# Patient Record
Sex: Male | Born: 1970 | Hispanic: Yes | Marital: Married | Smoking: Former smoker
Health system: Southern US, Community
[De-identification: ages and names within clinical notes are randomized; demographics above are authoritative.]

---

## 2016-08-03 ENCOUNTER — Emergency Department
Admission: EM | Admit: 2016-08-03 | Discharge: 2016-08-03 | Disposition: A | Discharge status: Home / Self Care | Payer: Self-pay | Source: Home / Self Care | Attending: Emergency Medicine | Admitting: Emergency Medicine

## 2016-08-03 ENCOUNTER — Telehealth: Payer: Self-pay | Admitting: *Deleted

## 2016-08-03 ENCOUNTER — Encounter: Payer: Self-pay | Admitting: *Deleted

## 2016-08-03 DIAGNOSIS — E049 Nontoxic goiter, unspecified: Secondary | ICD-10-CM

## 2016-08-03 MED ORDER — AZITHROMYCIN 250 MG PO TABS: ORAL_TABLET | ORAL | 0 refills | Status: DC

## 2016-08-03 MED ORDER — PREDNISONE 50 MG PO TABS: 50.0000 mg | ORAL_TABLET | Freq: Every day | ORAL | 0 refills | Status: DC

## 2016-08-03 MED ORDER — PROMETHAZINE-CODEINE 6.25-10 MG/5ML PO SYRP: ORAL_SOLUTION | ORAL | 0 refills | Status: DC

## 2016-08-03 MED ORDER — FLUTICASONE PROPIONATE 50 MCG/ACT NA SUSP: NASAL | 0 refills | Status: DC

## 2016-08-03 NOTE — Discharge Instructions (Signed)
You need to see a thyroid specialist for further evaluation and treatment. We will try to assist with referral to [facility] in [place] at your request.

## 2016-08-03 NOTE — ED Provider Notes (Signed)
[facility]    CSN: [id number] Arrival date & time: 08/03/16  0932     History   Chief Complaint Chief Complaint  Patient presents with  . Oral Swelling    HPI [facility] Samuel Cherry is a 46 y.o. male.   HPI  History reviewed. No pertinent past medical history.  There are no active problems to display for this patient.   History reviewed. No pertinent surgical history.   painless swelling anterior neck for 1 year. No fever or chills or nausea or vomiting or any definite dysphagia. He otherwise feels well.  He states his chief complaint is referral to an endocrinologist, he specifically requests referral to [name] in [facility] Medications     Family History History reviewed. No pertinent family history.  Social History Social History  Substance Use Topics  . Smoking status: Current Every Day Smoker  . Smokeless tobacco: Never Used     Comment: 1-2 cigs QD  . Alcohol use Yes     Comment: rarely     Allergies   Patient has no known allergies.   Review of Systems Review of Systems No fever  Physical Exam Triage Vital Signs ED Triage Vitals  Enc Vitals Group     BP 08/03/16 1007 138/86     Pulse Rate 08/03/16 1007 86     Resp 08/03/16 1007 16     Temp 08/03/16 1007 98.3 F (36.8 C)     Temp Source 08/03/16 1007 Oral     SpO2 08/03/16 1007 97 %     Weight 08/03/16 1008 292 lb (132.5 kg)     Height 08/03/16 1008  (1.803 m)     Head Circumference --      Peak Flow --      Pain Score 08/03/16 1008 0     Pain Loc --      Pain Edu? --      Excl. in GC? --    No data found.   Updated Vital Signs BP 138/86 (BP Location: Left Arm)   Pulse 86   Temp 98.3 F (36.8 C) (Oral)   Resp 16   Ht  (1.803 m)   Wt 292 lb (132.5 kg)   SpO2 97%   BMI 40.73 kg/m   Visual Acuity Right Eye Distance:   Left Eye Distance:   Bilateral Distance:    Right Eye Near:   Left Eye Near:    Bilateral Near:      Physical Exam Alert, no distress. ENT: Normal Neck: 5 x 5 cm firm slightly mobile anterior neck mass in the midline. No definite cervical or supraclavicular adenopathy   UC Treatments / Results  Labs (all labs ordered are listed, but only abnormal results are displayed) Labs Reviewed - No data to display  EKG  EKG Interpretation None       Radiology No results found.  Procedures Procedures (including critical care time)  Medications Ordered in UC Medications - No data to display   Initial Impression / Assessment and Plan / UC Course  I have reviewed the triage vital signs and the nursing notes.  Pertinent labs & imaging results that were available during my care of the patient were reviewed by me and considered in my medical decision making (see chart for details).    Final Clinical Impressions(s) / UC Diagnoses  Goiter/anterior neck mass for 1 year. He declined any blood testing today here in urgent care. I  agree with referral. Discussed option of ENT versus endocrinologist. Per his request,  referral to [facility] in [place] made for 08/09/2016.-Nurse gave him details of that referral appointment.    An After Visit Summary was printed and given to the patient.    Lajean Manes, MD 08/03/16 [phone]

## 2016-08-03 NOTE — ED Triage Notes (Signed)
Pt c/o swollen area in his neck x 1 year. He reports taking ABT's a year ago with some relief. Denies pain.

## 2016-08-03 NOTE — Telephone Encounter (Signed)
appt sch'ed with Dr Lucianne Muss for 08/09/16 @ 10:45am. Pt notified to bring $200.25 to his first appt since he is uninsured. Given info to call [facility] to apply for pt assistance.

## 2016-08-09 ENCOUNTER — Ambulatory Visit (INDEPENDENT_AMBULATORY_CARE_PROVIDER_SITE_OTHER): Payer: Self-pay | Admitting: Endocrinology

## 2016-08-09 ENCOUNTER — Encounter: Payer: Self-pay | Admitting: Endocrinology

## 2016-08-09 VITALS — BP 136/90 | HR 81 | Ht 71.0 in | Wt 290.0 lb

## 2016-08-09 DIAGNOSIS — R221 Localized swelling, mass and lump, neck: Secondary | ICD-10-CM

## 2016-08-09 NOTE — Progress Notes (Addendum)
Patient ID: Samuel Cherry, male   DOB: 01/15/1971, 46 y.o.   MRN: [id number]            Referring physician: Dr. Georgina Pillion   Reason for Appointment: Evaluation of swelling in neck    History of Present Illness:   The patient apparently noticed the swelling in his neck about a year ago which was not uncomfortable and not associated with any difficulty swallowing or change in voice More recently his wife noticed the swelling in the front of his neck and he was evaluated in the urgent care He was told that he had a thyroid nodule and is not referred for further evaluation He has not had any other studies done   No results found for: FREET4, TSH  Allergies as of 08/09/2016   No Known Allergies     Medication List    as of 08/09/2016 12:55 PM   You have not been prescribed any medications.     Allergies: No Known Allergies  History reviewed. No pertinent past medical history.   History reviewed. No pertinent surgical history.  Family History  Problem Relation Age of Onset  . Thyroid disease Neg Hx     Social History:  reports that he has been smoking.  He has never used smokeless tobacco. He reports that he drinks alcohol. He reports that he does not use drugs.   Review of Systems:  No history of recent weight change  He has some tiredness but he thinks this is from his going to bed late at night  No recent respiratory infections         Examination:   BP 136/90   Pulse 81   Ht  (1.803 m)   Wt 290 lb (131.5 kg)   BMI 40.45 kg/m    General Appearance:  well-looking        Eyes: No  prominence or swelling of the eyes         THYROID: This is not palpable He has a globular midline smooth slightly firm swelling in his anterior neck overlying the larynx and slightly inferior to the larynx; upper trachea is palpable below the swelling No swelling of the thyroid isthmus is palpable There is no lymphadenopathy in the  neck  Assessment/Plan:  Cystic-appearing swelling in the upper middle of the neck This may be a thyroglossal cyst or other cystic lesion Does not appear to have thyroid enlargement  Will have him get an ultrasound done and further management will depend on the results  [facility] 08/09/2016  Addendum: Ultrasound confirms complex cystic lesions of the neck and may well be congenital Since this is not acute and long-standing does not need intervention, given patient the option of going to an ENT surgeon No follow-up necessary here

## 2016-08-11 ENCOUNTER — Telehealth: Payer: Self-pay | Admitting: Endocrinology

## 2016-08-11 ENCOUNTER — Other Ambulatory Visit: Payer: Self-pay | Admitting: Endocrinology

## 2016-08-11 DIAGNOSIS — R221 Localized swelling, mass and lump, neck: Secondary | ICD-10-CM

## 2016-08-11 NOTE — Telephone Encounter (Signed)
Patient has been notified

## 2016-08-11 NOTE — Telephone Encounter (Signed)
New order has been done for [facility] imaging

## 2016-08-11 NOTE — Telephone Encounter (Addendum)
Patient wife stated she got a call from [facility] to schedule and appointment, he stated kumar did not say anything about it, but told him to go [place] imaging. Please advise  Evette [phone]

## 2016-08-11 NOTE — Telephone Encounter (Signed)
Pt called back in and request that a new order be sent to [facility], they said it is much more affordable there.

## 2016-08-11 NOTE — Telephone Encounter (Signed)
He wanted to get his neck ultrasound from a [facility] facility because of lack of insurance coverage and [facility] imaging is not part of this.  Please find out what needs to be done from our referral person

## 2016-08-12 ENCOUNTER — Other Ambulatory Visit: Payer: Self-pay | Admitting: Internal Medicine

## 2016-08-12 ENCOUNTER — Telehealth: Payer: Self-pay

## 2016-08-12 NOTE — Telephone Encounter (Signed)
This was done yesterday, please check the chart for confirmation when you get a question

## 2016-08-12 NOTE — Telephone Encounter (Signed)
[facility] called stating the orders for this patient are still not in please advise

## 2016-08-12 NOTE — Telephone Encounter (Signed)
Pt called back and said that [facility] has not gotten the referral and they want to know what is going on.

## 2016-08-13 ENCOUNTER — Other Ambulatory Visit: Payer: Self-pay | Admitting: Endocrinology

## 2016-08-13 DIAGNOSIS — R221 Localized swelling, mass and lump, neck: Secondary | ICD-10-CM

## 2016-08-13 NOTE — Telephone Encounter (Signed)
Called [facility] to inform that the order was placed and to give me a call back if they still can not see the order

## 2016-08-13 NOTE — Telephone Encounter (Signed)
[facility] called back and stated that the order is not viewable by them. The [place] order needs to be placed under the imaging tab so that [facility] can call the patient to schedule the appointment

## 2016-08-13 NOTE — Telephone Encounter (Signed)
I have ordered the ultrasound again as usual, I do not know of any other way to order

## 2016-08-16 ENCOUNTER — Ambulatory Visit (HOSPITAL_COMMUNITY): Payer: Self-pay

## 2016-08-19 ENCOUNTER — Ambulatory Visit
Admission: RE | Admit: 2016-08-19 | Discharge: 2016-08-19 | Disposition: A | Discharge status: Home / Self Care | Payer: No Typology Code available for payment source | Source: Ambulatory Visit | Attending: Endocrinology | Admitting: Endocrinology

## 2016-08-19 DIAGNOSIS — R221 Localized swelling, mass and lump, neck: Secondary | ICD-10-CM

## 2016-08-19 NOTE — Progress Notes (Signed)
Please call to let patient know that the ultrasound shows fluid collections and no thyroid problem Since these have been present for a long time does not necessarily need treatment unless having discomfort.  If he wants he can be referred to a ENT physician

## 2016-08-21 ENCOUNTER — Other Ambulatory Visit: Payer: Self-pay | Admitting: Endocrinology

## 2016-08-21 DIAGNOSIS — Q892 Congenital malformations of other endocrine glands: Secondary | ICD-10-CM

## 2018-06-02 ENCOUNTER — Encounter: Payer: Self-pay | Admitting: Family Medicine

## 2018-06-02 ENCOUNTER — Ambulatory Visit: Payer: No Typology Code available for payment source | Admitting: Family Medicine

## 2018-06-02 VITALS — BP 122/68 | HR 89 | Ht 71.0 in | Wt 269.0 lb

## 2018-06-02 DIAGNOSIS — R7309 Other abnormal glucose: Secondary | ICD-10-CM

## 2018-06-02 DIAGNOSIS — E119 Type 2 diabetes mellitus without complications: Secondary | ICD-10-CM | POA: Insufficient documentation

## 2018-06-02 DIAGNOSIS — E1165 Type 2 diabetes mellitus with hyperglycemia: Secondary | ICD-10-CM | POA: Diagnosis not present

## 2018-06-02 DIAGNOSIS — R531 Weakness: Secondary | ICD-10-CM | POA: Diagnosis not present

## 2018-06-02 DIAGNOSIS — R5383 Other fatigue: Secondary | ICD-10-CM

## 2018-06-02 DIAGNOSIS — Z23 Encounter for immunization: Secondary | ICD-10-CM

## 2018-06-02 DIAGNOSIS — R748 Abnormal levels of other serum enzymes: Secondary | ICD-10-CM

## 2018-06-02 DIAGNOSIS — Z6837 Body mass index (BMI) 37.0-37.9, adult: Secondary | ICD-10-CM

## 2018-06-02 LAB — POCT UA - MICROALBUMIN
Albumin/Creatinine Ratio, Urine, POC: <30
Creatinine, POC: 300 mg/dL
Microalbumin Ur, POC: 80 mg/L

## 2018-06-02 LAB — POCT GLYCOSYLATED HEMOGLOBIN (HGB A1C): Hemoglobin A1C: 10.3 % — AB (ref 4.0–5.6)

## 2018-06-02 MED ORDER — GLIPIZIDE 5 MG PO TABS: 5.0000 mg | ORAL_TABLET | Freq: Two times a day (BID) | ORAL | 3 refills | Status: DC

## 2018-06-02 MED ORDER — METFORMIN HCL 1000 MG PO TABS: 1000.0000 mg | ORAL_TABLET | Freq: Two times a day (BID) | ORAL | 3 refills | Status: DC

## 2018-06-02 NOTE — Patient Instructions (Signed)
Diabetes mellitus y nutricin, en adultos  Diabetes Mellitus and Nutrition, Adult  Si sufre de diabetes (diabetes mellitus), es muy importante tener hbitos alimenticios saludables debido a que sus niveles de azcar en la sangre (glucosa) se ven afectados en gran medida por lo que come y bebe. Comer alimentos saludables en las cantidades adecuadas, aproximadamente a la misma hora todos los das, lo ayudar a:   Controlar la glucemia.   Disminuir el riesgo de sufrir una enfermedad cardaca.   Mejorar la presin arterial.   Alcanzar o mantener un peso saludable.  Todas las personas que sufren de diabetes son diferentes y cada una tiene necesidades diferentes en cuanto a un plan de alimentacin. El mdico puede recomendarle que trabaje con un especialista en dietas y nutricin (nutricionista) para elaborar el mejor plan para usted. Su plan de alimentacin puede variar segn factores como:   Las caloras que necesita.   Los medicamentos que toma.   Su peso.   Sus niveles de glucemia, presin arterial y colesterol.   Su nivel de actividad.   Otras afecciones que tenga, como enfermedades cardacas o renales.  Cmo me afectan los carbohidratos?  Los carbohidratos, o hidratos de carbono, afectan su nivel de glucemia ms que cualquier otro tipo de alimento. La ingesta de carbohidratos naturalmente aumenta la cantidad de glucosa en la sangre. El recuento de carbohidratos es un mtodo destinado a llevar un registro de la cantidad de carbohidratos que se consumen. El recuento de carbohidratos es importante para mantener la glucemia a un nivel saludable, especialmente si utiliza insulina o toma determinados medicamentos por va oral para la diabetes.  Es importante conocer la cantidad de carbohidratos que se pueden ingerir en cada comida sin correr ningn riesgo. Esto es diferente en cada persona. Su nutricionista puede ayudarlo a calcular la cantidad de carbohidratos que debe ingerir en cada comida y en cada  refrigerio.  Entre los alimentos que contienen carbohidratos, se incluyen:   Pan, cereal, arroz, pastas y galletas.   Papas y maz.   Guisantes, frijoles y lentejas.   Leche y yogur.   Frutas y jugo.   Postres, como pasteles, galletas, helado y caramelos.  Cmo me afecta el alcohol?  El alcohol puede provocar disminuciones sbitas de la glucemia (hipoglucemia), especialmente si utiliza insulina o toma determinados medicamentos por va oral para la diabetes. La hipoglucemia es una afeccin potencialmente mortal. Los sntomas de la hipoglucemia (somnolencia, mareos y confusin) son similares a los sntomas de haber consumido demasiado alcohol.  Si el mdico afirma que el alcohol es seguro para usted, siga estas pautas:   Limite el consumo de alcohol a no ms de 1medida por da si es mujer y no est embarazada, y a 2medidas si es hombre. Una medida equivale a 12oz (355ml) de cerveza, 5oz (148ml) de vino o 1oz (44ml) de bebidas alcohlicas de alta graduacin.   No beba con el estmago vaco.   Mantngase hidratado bebiendo agua, refrescos dietticos o t helado sin azcar.   Tenga en cuenta que los refrescos comunes, los jugos y otras bebida para mezclar pueden contener mucha azcar y se deben contar como carbohidratos.  Cules son algunos consejos para seguir este plan?    Leer las etiquetas de los alimentos   Comience por leer el tamao de la porcin en la "Informacin nutricional" en las etiquetas de los alimentos envasados y las bebidas. La cantidad de caloras, carbohidratos, grasas y otros nutrientes mencionados en la etiqueta se basan en una porcin del alimento.   Muchos alimentos contienen ms de una porcin por envase.   Verifique la cantidad total de gramos (g) de carbohidratos totales en una porcin. Puede calcular la cantidad de porciones de carbohidratos al dividir el total de carbohidratos por 15. Por ejemplo, si un alimento tiene un total de 30g de carbohidratos, equivale a 2  porciones de carbohidratos.   Verifique la cantidad de gramos (g) de grasas saturadas y grasas trans en una porcin. Escoja alimentos que no contengan grasa o que tengan un bajo contenido.   Verifique la cantidad de miligramos (mg) de sal (sodio) en una porcin. La mayora de las personas deben limitar la ingesta de sodio total a menos de 2300mg por da.   Siempre consulte la informacin nutricional de los alimentos etiquetados como "con bajo contenido de grasa" o "sin grasa". Estos alimentos pueden tener un mayor contenido de azcar agregada o carbohidratos refinados, y deben evitarse.   Hable con su nutricionista para identificar sus objetivos diarios en cuanto a los nutrientes mencionados en la etiqueta.  Al ir de compras   Evite comprar alimentos procesados, enlatados o precocinados. Estos alimentos tienden a tener una mayor cantidad de grasa, sodio y azcar agregada.   Compre en la zona exterior de la tienda de comestibles. Esta zona incluye frutas y verduras frescas, granos a granel, carnes frescas y productos lcteos frescos.  Al cocinar   Utilice mtodos de coccin a baja temperatura, como hornear, en lugar de mtodos de coccin a alta temperatura, como frer en abundante aceite.   Cocine con aceites saludables, como el aceite de oliva, canola o girasol.   Evite cocinar con manteca, crema o carnes con alto contenido de grasa.  Planificacin de las comidas   Coma las comidas y los refrigerios regularmente, preferentemente a la misma hora todos los das. Evite pasar largos perodos de tiempo sin comer.   Consuma alimentos ricos en fibra, como frutas frescas, verduras, frijoles y cereales integrales. Consulte a su nutricionista sobre cuntas porciones de carbohidratos puede consumir en cada comida.   Consuma entre 4 y 6 onzas (oz) de protenas magras por da, como carnes magras, pollo, pescado, huevos o tofu. Una onza de protena magra equivale a:  ? 1 onza de carne, pollo o  pescado.  ? 1huevo.  ?  taza de tofu.   Coma algunos alimentos por da que contengan grasas saludables, como aguacates, frutos secos, semillas y pescado.  Estilo de vida   Controle su nivel de glucemia con regularidad.   Haga actividad fsica habitualmente como se lo haya indicado el mdico. Esto puede incluir lo siguiente:  ? 150minutos semanales de ejercicio de intensidad moderada o alta. Esto podra incluir caminatas dinmicas, ciclismo o gimnasia acutica.  ? Realizar ejercicios de elongacin y de fortalecimiento, como yoga o levantamiento de pesas, por lo menos 2veces por semana.   Tome los medicamentos como se lo haya indicado el mdico.   No consuma ningn producto que contenga nicotina o tabaco, como cigarrillos y cigarrillos electrnicos. Si necesita ayuda para dejar de fumar, consulte al mdico.   Trabaje con un asesor o instructor en diabetes para identificar estrategias para controlar el estrs y cualquier desafo emocional y social.  Preguntas para hacerle al mdico   Es necesario que consulte a un instructor en el cuidado de la diabetes?   Es necesario que me rena con un nutricionista?   A qu nmero puedo llamar si tengo preguntas?   Cules son los mejores momentos para controlar la glucemia?  Dnde   encontrar ms informacin:   Asociacin Estadounidense de la Diabetes (American Diabetes Association): diabetes.org   Academia de Nutricin y Diettica (Academy of Nutrition and Dietetics): www.eatright.org   Instituto Nacional de la Diabetes y las Enfermedades Digestivas y Renales (National Institute of Diabetes and Digestive and Kidney Diseases, NIH): www.niddk.nih.gov  Resumen   Un plan de alimentacin saludable lo ayudar a controlar la glucemia y mantener un estilo de vida saludable.   Trabajar con un especialista en dietas y nutricin (nutricionista) puede ayudarlo a elaborar el mejor plan de alimentacin para usted.   Tenga en cuenta que los carbohidratos (hidratos de  carbono) y el alcohol tienen efectos inmediatos en sus niveles de glucemia. Es importante contar los carbohidratos que ingiere y consumir alcohol con prudencia.  Esta informacin no tiene como fin reemplazar el consejo del mdico. Asegrese de hacerle al mdico cualquier pregunta que tenga.  Document Released: 07/13/2007 Document Revised: 12/14/2016 Document Reviewed: 07/26/2016  Elsevier Interactive Patient Education  2019 Elsevier Inc.

## 2018-06-02 NOTE — Progress Notes (Signed)
New Patient Office Visit  Subjective:  Patient ID: Samuel Cherry, male    DOB: [birth year]  Age: 48 y.o. MRN: [id number]  CC:  Chief Complaint  Patient presents with  . Establish Care    dx w/DM at the hospital. pt was here visiting from [place]    HPI Samuel Cherry presents for Diabetes.  + fam hx.  He was dx in December 2019 when seen in the ED for viral gastroenteritis.  He reports inc urination and vision changes. He is here today with his wife.    He was also noted ot have elevated liver enzymes. He is overweight.  Doesn't really drink much alcohol.   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 Moshe Cipro.  Reveiwed ED note from December.  -He had actually gone to the ED for an episode of nausea vomiting, diarrhea and fever.  Mostly had gone because he had vomited some bright red blood.  It actually recommended that he follow-up with GI but he has not done that yet.  His AST was elevated at 75, ALT elevated at 86 and the alkaline phosphatase was high at 161.  Renal function was normal.  Kos was also elevated over 200 at that time.   Hepatic Function Latest Ref Rng & Units 06/02/2018  Total Protein 6.1 - 8.1 g/dL 7.3  AST 10 - 40 U/L 25(W)  ALT 9 - 46 U/L 56(H)  Total Bilirubin 0.2 - 1.2 mg/dL 0.4     No past medical history on file.  No past surgical history on file.  Family History  Problem Relation Age of Onset  . Thyroid disease Neg Hx     Social History   Socioeconomic History  . Marital status: Married    Spouse name: Not on file  . Number of children: Not on file  . Years of education: Not on file  . Highest education level: Not on file  Occupational History  . Not on file  Social Needs  . Financial resource strain: Not on file  . Food insecurity:    Worry: Not on file    Inability: Not on file  . Transportation needs:    Medical: Not on file     Non-medical: Not on file  Tobacco Use  . Smoking status: Former Smoker    Last attempt to quit: 04/20/2016    Years since quitting: 2.1  . Smokeless tobacco: Never Used  . Tobacco comment: 1-2 cigs QD  Substance and Sexual Activity  . Alcohol use: Yes    Alcohol/week: 1.0 standard drinks    Types: 1 Cans of beer per week    Comment: rarely  . Drug use: No  . Sexual activity: Not on file  Lifestyle  . Physical activity:    Days per week: Not on file    Minutes per session: Not on file  . Stress: Not on file  Relationships  . Social connections:    Talks on phone: Not on file    Gets together: Not on file    Attends religious service: Not on file    Active member of club or organization: Not on file    Attends meetings of clubs or organizations: Not on file    Relationship status: Not on file  . Intimate partner violence:    Fear of current or ex partner: Not on file  Emotionally abused: Not on file    Physically abused: Not on file    Forced sexual activity: Not on file  Other Topics Concern  . Not on file  Social History Narrative  . Not on file    ROS Review of Systems  Objective:   Today's Vitals: BP 122/68   Pulse 89   Ht 5\' 11"  (1.803 m)   Wt 269 lb (122 kg)   SpO2 97%   BMI 37.52 kg/m   Physical Exam Constitutional:      Appearance: Normal appearance. He is well-developed. He is obese.  HENT:     Head: Normocephalic and atraumatic.     Nose: Nose normal.  Eyes:     Conjunctiva/sclera: Conjunctivae normal.     Pupils: Pupils are equal, round, and reactive to light.  Neck:     Musculoskeletal: Neck supple.     Vascular: No carotid bruit.  Cardiovascular:     Rate and Rhythm: Normal rate and regular rhythm.     Heart sounds: Normal heart sounds.  Pulmonary:     Effort: Pulmonary effort is normal.     Breath sounds: Normal breath sounds.  Abdominal:     General: Bowel sounds are normal.     Palpations: Abdomen is soft. There is no mass.   Skin:    General: Skin is warm and dry.  Neurological:     Mental Status: He is alert and oriented to person, place, and time.  Psychiatric:        Behavior: Behavior normal.     Assessment & Plan:   Problem List Items Addressed This Visit      Endocrine   Uncontrolled type 2 diabetes mellitus with hyperglycemia (HCC)    Discussed tx options. Will start with metformin and glipizide. Rx given for meter. Dicussed need for dietary changes. Will refer for nutrition therapy.        Relevant Medications   metFORMIN (GLUCOPHAGE) 1000 MG tablet   glipiZIDE (GLUCOTROL) 5 MG tablet   Other Relevant Orders   CBC (Completed)   COMPLETE METABOLIC PANEL WITH GFR (Completed)   TSH (Completed)   Hepatitis panel, acute (Completed)   Amb ref to Medical Nutrition Therapy-MNT     Other   Weakness   Relevant Orders   CBC (Completed)   COMPLETE METABOLIC PANEL WITH GFR (Completed)   TSH (Completed)   Hepatitis panel, acute (Completed)   Elevated liver enzymes    Will recheck enzymes. May be seconday to uncontrolled DM.  Also consider fatty liver so may need further w/u if enzymes are not improving.       Relevant Orders   CBC (Completed)   COMPLETE METABOLIC PANEL WITH GFR (Completed)   TSH (Completed)   Hepatitis panel, acute (Completed)   BMI 37.0-37.9, adult    Discussed weight loss.        Other Visit Diagnoses    Abnormal glucose    -  Primary   Relevant Orders   POCT glycosylated hemoglobin (Hb A1C) (Completed)   POCT UA - Microalbumin (Completed)   CBC (Completed)   COMPLETE METABOLIC PANEL WITH GFR (Completed)   TSH (Completed)   Hepatitis panel, acute (Completed)   Need for tetanus, diphtheria, and acellular pertussis (Tdap) vaccine in patient of adolescent age or older       Relevant Orders   Tdap vaccine greater than or equal to 7yo IM (Completed)   Fatigue, unspecified type       Relevant Orders  CBC (Completed)   COMPLETE METABOLIC PANEL WITH GFR (Completed)    TSH (Completed)   Hepatitis panel, acute (Completed)     Fatigue - will check for anemia and thyroid dz.     Tdap given.    Outpatient Encounter Medications as of 06/02/2018  Medication Sig  . [DISCONTINUED] dicyclomine (BENTYL) 20 MG tablet Take 1 tablet by mouth 3 (three) times daily as needed.  . [DISCONTINUED] omeprazole (PRILOSEC) 20 MG capsule Take 2 capsules by mouth daily.  Marland Kitchen. glipiZIDE (GLUCOTROL) 5 MG tablet Take 1 tablet (5 mg total) by mouth 2 (two) times daily before a meal.  . metFORMIN (GLUCOPHAGE) 1000 MG tablet Take 1 tablet (1,000 mg total) by mouth 2 (two) times daily with a meal.  . [DISCONTINUED] metFORMIN (GLUCOPHAGE) 500 MG tablet Take 1 tablet by mouth 2 (two) times daily with a meal.   No facility-administered encounter medications on file as of 06/02/2018.     Follow-up: Return in about 6 weeks (around 07/14/2018) for Diabetes .   Nani Gasseratherine Asher Babilonia, MD

## 2018-06-03 LAB — HEPATITIS PANEL, ACUTE
Hep A IgM: NONREACTIVE
Hep B C IgM: NONREACTIVE
Hepatitis B Surface Ag: NONREACTIVE
Hepatitis C Ab: NONREACTIVE
SIGNAL TO CUT-OFF: 0.02 (ref <1.00)

## 2018-06-03 LAB — CBC
HCT: 44 % (ref 38.5–50.0)
Hemoglobin: 14.8 g/dL (ref 13.2–17.1)
MCH: 30.6 pg (ref 27.0–33.0)
MCHC: 33.6 g/dL (ref 32.0–36.0)
MCV: 91.1 fL (ref 80.0–100.0)
MPV: 12.3 fL (ref 7.5–12.5)
Platelets: 307 10*3/uL (ref 140–400)
RBC: 4.83 10*6/uL (ref 4.20–5.80)
RDW: 11.8 % (ref 11.0–15.0)
WBC: 9.4 10*3/uL (ref 3.8–10.8)

## 2018-06-03 LAB — COMPLETE METABOLIC PANEL WITH GFR
AG Ratio: 1 (calc) (ref 1.0–2.5)
ALT: 56 U/L — ABNORMAL HIGH (ref 9–46)
AST: 41 U/L — ABNORMAL HIGH (ref 10–40)
Albumin: 3.7 g/dL (ref 3.6–5.1)
Alkaline phosphatase (APISO): 124 U/L (ref 36–130)
BUN: 12 mg/dL (ref 7–25)
CO2: 23 mmol/L (ref 20–32)
Calcium: 9.6 mg/dL (ref 8.6–10.3)
Chloride: 105 mmol/L (ref 98–110)
Creat: 0.75 mg/dL (ref 0.60–1.35)
GFR, Est African American: 127 mL/min/{1.73_m2} (ref >=60)
GFR, Est Non African American: 109 mL/min/{1.73_m2} (ref >=60)
Globulin: 3.6 g/dL (calc) (ref 1.9–3.7)
Glucose, Bld: 200 mg/dL — ABNORMAL HIGH (ref 65–99)
Potassium: 4.9 mmol/L (ref 3.5–5.3)
Sodium: 136 mmol/L (ref 135–146)
Total Bilirubin: 0.4 mg/dL (ref 0.2–1.2)
Total Protein: 7.3 g/dL (ref 6.1–8.1)

## 2018-06-03 LAB — TSH: TSH: 1.96 mIU/L (ref 0.40–4.50)

## 2018-06-06 DIAGNOSIS — Z6837 Body mass index (BMI) 37.0-37.9, adult: Secondary | ICD-10-CM

## 2018-06-06 DIAGNOSIS — Z6835 Body mass index (BMI) 35.0-35.9, adult: Secondary | ICD-10-CM | POA: Insufficient documentation

## 2018-06-06 DIAGNOSIS — Z6838 Body mass index (BMI) 38.0-38.9, adult: Secondary | ICD-10-CM | POA: Insufficient documentation

## 2018-06-06 DIAGNOSIS — R748 Abnormal levels of other serum enzymes: Secondary | ICD-10-CM | POA: Insufficient documentation

## 2018-06-06 NOTE — Assessment & Plan Note (Signed)
Discussed weight loss. 

## 2018-06-06 NOTE — Assessment & Plan Note (Addendum)
Discussed tx options. Will start with metformin and glipizide. Rx given for meter. Dicussed need for dietary changes. Will refer for nutrition therapy.

## 2018-06-06 NOTE — Assessment & Plan Note (Signed)
Will recheck enzymes. May be seconday to uncontrolled DM.  Also consider fatty liver so may need further w/u if enzymes are not improving.

## 2018-06-07 ENCOUNTER — Encounter: Payer: Self-pay | Admitting: Family Medicine

## 2018-07-11 ENCOUNTER — Telehealth: Payer: Self-pay | Admitting: Family Medicine

## 2018-07-11 DIAGNOSIS — E1165 Type 2 diabetes mellitus with hyperglycemia: Secondary | ICD-10-CM

## 2018-07-11 MED ORDER — BLOOD GLUCOSE METER KIT: PACK | 0 refills | Status: DC

## 2018-07-11 NOTE — Telephone Encounter (Signed)
Called pt changed appt 07/17/18 to virtual appt. Pt states he haas misplaced his glucometer and has been unable to check his blood sugars for four days. Please advise. Pt states he uses [facility] in [place].

## 2018-07-11 NOTE — Telephone Encounter (Signed)
rx for meter faxed.Laureen Ochs, Viann Shove, CMA

## 2018-07-17 ENCOUNTER — Ambulatory Visit (INDEPENDENT_AMBULATORY_CARE_PROVIDER_SITE_OTHER): Payer: BLUE CROSS/BLUE SHIELD | Admitting: Family Medicine

## 2018-07-17 ENCOUNTER — Other Ambulatory Visit: Payer: Self-pay

## 2018-07-17 ENCOUNTER — Encounter: Payer: Self-pay | Admitting: Family Medicine

## 2018-07-17 VITALS — BP 135/84 | HR 81 | Temp 98.3°F | Wt 266.0 lb

## 2018-07-17 DIAGNOSIS — E1165 Type 2 diabetes mellitus with hyperglycemia: Secondary | ICD-10-CM

## 2018-07-17 DIAGNOSIS — R748 Abnormal levels of other serum enzymes: Secondary | ICD-10-CM | POA: Diagnosis not present

## 2018-07-17 MED ORDER — GLIPIZIDE 5 MG PO TABS: 5.0000 mg | ORAL_TABLET | Freq: Two times a day (BID) | ORAL | 1 refills | Status: DC

## 2018-07-17 MED ORDER — METFORMIN HCL 1000 MG PO TABS: 1000.0000 mg | ORAL_TABLET | Freq: Two times a day (BID) | ORAL | 1 refills | Status: DC

## 2018-07-17 NOTE — Progress Notes (Signed)
  Virtual Visit via Telephone Note  I connected with Samuel Cherry on 07/17/18 at 10:50 AM EDT by telephone and verified that I am speaking with the correct person using two identifiers.   I discussed the limitations, risks, security and privacy concerns of performing an evaluation and management service by telephone and the availability of in person appointments. I also discussed with the patient that there may be a patient responsible charge related to this service. The patient expressed understanding and agreed to proceed.       Subjective:    CC: DM  HPI:  Diabetes - 2 wks ago he started feeling "weird" and his BS was around 50. hea hadn't really been eating breakfast. No wounds or sores that are not healing well. No increased thirst or urination. Checking glucose at home. FBS:141; after meals-95.  Taking medications as prescribed without any side effects. He is now on metformin and glipizide. He is eating more vegetable.  He is trying to eat a little something in the morning.  No more nausea on the metformin.    Feels his dizziness is much better.  He feels his energy level is better.  he has been more active with his son. Visoin is still changing.      Past medical history, Surgical history, Family history not pertinant except as noted below, Social history, Allergies, and medications have been entered into the medical record, reviewed, and corrections made.   Review of Systems: No fevers, chills, night sweats, weight loss, chest pain, or shortness of breath.   Objective:    General: Speaking clearly in complete sentences without any shortness of breath.  Alert and oriented x3.  Normal judgment. No apparent acute distress.    Impression and Recommendations:    DM- sounds like blood sugars are improving.  Almost at goal. He has made some Discussed starting a statin today.  Encouraged him to think about it. Explained how reduces risk of stroke. Will schedule eye exam  for later this summer. Plan to f/u in 2 months for next A!C   Elevated liver enzymes - should be improving as glucose is coming down.    I discussed the assessment and treatment plan with the patient. The patient was provided an opportunity to ask questions and all were answered. The patient agreed with the plan and demonstrated an understanding of the instructions.   The patient was advised to call back or seek an in-person evaluation if the symptoms worsen or if the condition fails to improve as anticipated.  I provided 15 minutes of non-face-to-face time during this encounter.   Nani Gasser, MD

## 2018-07-17 NOTE — Progress Notes (Signed)
FBS:141; after meals-95 144 -30 day average before meals   2 wks ago he started feeling "weird" and his BS was around 50. I told him that it was because he probably hadn't eaten enough if at all. I advised him of the importance of keeping a schedule of taking his medications at the same time every day and being sure to eat something when he takes the two medications due to it causing his BS to drop when he does take them. He voiced understanding. I also talked to him about getting an eye exam and he was unsure about doing this right now because of his blood sugars. I told him that it is important that he get this done and he should schedule this asap.Laureen Ochs, Viann Shove, CMA

## 2018-08-09 ENCOUNTER — Telehealth: Payer: Self-pay | Admitting: Family Medicine

## 2018-08-09 NOTE — Telephone Encounter (Signed)
Okay to take either ibuprofen or Tylenol.  I would actually alternate.  So 600 mg of ibuprofen and then 4 hours later 2 tabs of Tylenol.  Then 4 hours later repeat the ibuprofen and then 4 hours later repeat the Tylenol.  This will help control his pain until he can get in and have more definitive treatment with his dentist.

## 2018-08-09 NOTE — Telephone Encounter (Signed)
Pt is having some dental pain, has appt tomorrow with dentist to discuss getting the tooth pulled. Questions what he can take in the meantime for pain? Wife wants to know with his Rx's if he can take tylenol or ibuprofen? Routing.

## 2018-08-09 NOTE — Telephone Encounter (Signed)
Pt's wife advised. Verbalized understanding.  

## 2018-10-11 ENCOUNTER — Other Ambulatory Visit: Payer: Self-pay | Admitting: *Deleted

## 2018-10-11 DIAGNOSIS — E1165 Type 2 diabetes mellitus with hyperglycemia: Secondary | ICD-10-CM

## 2018-10-11 MED ORDER — GLIPIZIDE 5 MG PO TABS: 5.0000 mg | ORAL_TABLET | Freq: Two times a day (BID) | ORAL | 1 refills | Status: DC

## 2018-10-11 MED ORDER — GLUCOSE BLOOD VI STRP: ORAL_STRIP | 4 refills | Status: DC

## 2018-10-11 MED ORDER — ONETOUCH DELICA LANCETS 33G MISC: 4 refills | Status: DC

## 2018-10-11 MED ORDER — METFORMIN HCL 1000 MG PO TABS: 1000.0000 mg | ORAL_TABLET | Freq: Two times a day (BID) | ORAL | 1 refills | Status: DC

## 2018-10-12 ENCOUNTER — Encounter: Payer: Self-pay | Admitting: Family Medicine

## 2018-10-12 ENCOUNTER — Ambulatory Visit: Payer: BLUE CROSS/BLUE SHIELD | Admitting: Family Medicine

## 2018-10-12 VITALS — BP 137/79 | HR 71 | Ht 71.0 in | Wt 270.0 lb

## 2018-10-12 DIAGNOSIS — E119 Type 2 diabetes mellitus without complications: Secondary | ICD-10-CM

## 2018-10-12 DIAGNOSIS — E1165 Type 2 diabetes mellitus with hyperglycemia: Secondary | ICD-10-CM | POA: Diagnosis not present

## 2018-10-12 DIAGNOSIS — Z23 Encounter for immunization: Secondary | ICD-10-CM | POA: Diagnosis not present

## 2018-10-12 LAB — POCT GLYCOSYLATED HEMOGLOBIN (HGB A1C): Hemoglobin A1C: 6.4 % — AB (ref 4.0–5.6)

## 2018-10-12 MED ORDER — ATORVASTATIN CALCIUM 20 MG PO TABS: 20.0000 mg | ORAL_TABLET | Freq: Every day | ORAL | 3 refills | Status: DC

## 2018-10-12 NOTE — Addendum Note (Signed)
Addended by: Teddy Spike on: 10/12/2018 01:55 PM   Modules accepted: Orders

## 2018-10-12 NOTE — Patient Instructions (Signed)
Your A1C looks fantastic today!!! [place] job.  Keep up the good work  I just want you to focus on losing about 5-10 pounds over the summer.  Just work on trying to get your activity level increased.  If you notice you are having more low blood sugars as you lose weight then let me know and I can always adjust your medication down.

## 2018-10-12 NOTE — Progress Notes (Signed)
Established Patient Office Visit  Subjective:  Patient ID: Samuel Cherry, male    DOB: Nov 06, 1970  Age: 48 y.o. MRN: [id number]  CC:  Chief Complaint  Patient presents with  . Diabetes    HPI [place] presents for   Diabetes - no hypoglycemic events. No wounds or sores that are not healing well. No increased thirst or urination. Checking glucose at home. Taking medications as prescribed without any side effects.   No past medical history on file.  No past surgical history on file.  Family History  Problem Relation Age of Onset  . Thyroid disease Neg Hx     Social History   Socioeconomic History  . Marital status: Married    Spouse name: Not on file  . Number of children: Not on file  . Years of education: Not on file  . Highest education level: Not on file  Occupational History  . Not on file  Social Needs  . Financial resource strain: Not on file  . Food insecurity    Worry: Not on file    Inability: Not on file  . Transportation needs    Medical: Not on file    Non-medical: Not on file  Tobacco Use  . Smoking status: Former Smoker    Quit date: 04/20/2016    Years since quitting: 2.4  . Smokeless tobacco: Never Used  . Tobacco comment: 1-2 cigs QD  Substance and Sexual Activity  . Alcohol use: Yes    Alcohol/week: 1.0 standard drinks    Types: 1 Cans of beer per week    Comment: rarely  . Drug use: No  . Sexual activity: Not on file  Lifestyle  . Physical activity    Days per week: Not on file    Minutes per session: Not on file  . Stress: Not on file  Relationships  . Social Herbalist on phone: Not on file    Gets together: Not on file    Attends religious service: Not on file    Active member of club or organization: Not on file    Attends meetings of clubs or organizations: Not on file    Relationship status: Not on file  . Intimate partner violence    Fear of current or ex partner: Not on file   Emotionally abused: Not on file    Physically abused: Not on file    Forced sexual activity: Not on file  Other Topics Concern  . Not on file  Social History Narrative  . Not on file    Outpatient Medications Prior to Visit  Medication Sig Dispense Refill  . blood glucose meter kit and supplies Dispense based on patient and insurance preference. Use up to four times daily as directed. Dx:E11.65 1 each 0  . glipiZIDE (GLUCOTROL) 5 MG tablet Take 1 tablet (5 mg total) by mouth 2 (two) times daily before a meal. 180 tablet 1  . glucose blood test strip Use for testing blood sugar four times daily.E11.65 900 each 4  . metFORMIN (GLUCOPHAGE) 1000 MG tablet Take 1 tablet (1,000 mg total) by mouth 2 (two) times daily with a meal. 180 tablet 1  . OneTouch Delica Lancets 52D MISC USE TO CHECK BLOOD SUGAR FOUR TIMES DAILY AS DIRECTED. E11.65 1200 each 4   No facility-administered medications prior to visit.     No Known Allergies  ROS Review of Systems    Objective:  Physical Exam  Constitutional: He is oriented to person, place, and time. He appears well-developed and well-nourished.  HENT:  Head: Normocephalic and atraumatic.  Cardiovascular: Normal rate, regular rhythm and normal heart sounds.  Pulmonary/Chest: Effort normal and breath sounds normal.  Neurological: He is alert and oriented to person, place, and time.  Skin: Skin is warm and dry.  Psychiatric: He has a normal mood and affect. His behavior is normal.    BP 137/79   Pulse 71   Ht _0  (1.803 m)   Wt 270 lb (122.5 kg)   SpO2 98%   BMI 37.66 kg/m  Wt Readings from Last 3 Encounters:  10/12/18 270 lb (122.5 kg)  07/17/18 266 lb (120.7 kg)  06/02/18 269 lb (122 kg)     There are no preventive care reminders to display for this patient.  There are no preventive care reminders to display for this patient.  Lab Results  Component Value Date   TSH 1.96 06/02/2018   Lab Results  Component Value Date    WBC 9.4 06/02/2018   HGB 14.8 06/02/2018   HCT 44.0 06/02/2018   MCV 91.1 06/02/2018   PLT 307 06/02/2018   Lab Results  Component Value Date   NA 136 06/02/2018   K 4.9 06/02/2018   CO2 23 06/02/2018   GLUCOSE 200 (H) 06/02/2018   BUN 12 06/02/2018   CREATININE 0.75 06/02/2018   BILITOT 0.4 06/02/2018   AST 41 (H) 06/02/2018   ALT 56 (H) 06/02/2018   PROT 7.3 06/02/2018   CALCIUM 9.6 06/02/2018   No results found for: CHOL No results found for: HDL No results found for: LDLCALC No results found for: TRIG No results found for: CHOLHDL Lab Results  Component Value Date   HGBA1C 6.4 (A) 10/12/2018      Assessment & Plan:   Problem List Items Addressed This Visit      Endocrine   Controlled type 2 diabetes mellitus without complication, without long-term current use of insulin (HCC) - Primary   Relevant Medications   atorvastatin (LIPITOR) 20 MG tablet   Other Relevant Orders   Lipid panel   BASIC METABOLIC PANEL WITH GFR     Hemoglobin A1c looks fantastic today at 6.4.  Huge improvement from last time but it sounds like he really has made some major dietary changes, though he has not lost any weight.  Did encourage him to start exercising and get back on track and try to at least lose 5 to 10 pounds.  We discussed getting his pneumonia vaccine updated today as well as starting a statin to reduce his risk for heart attack and stroke.  He did agree to that and plans on going to the lab today to have a fasting lipid panel drawn.  Lab Results  Component Value Date   HGBA1C 6.4 (A) 10/12/2018     Meds ordered this encounter  Medications  . DISCONTD: atorvastatin (LIPITOR) 20 MG tablet    Sig: Take 1 tablet (20 mg total) by mouth at bedtime.    Dispense:  90 tablet    Refill:  3  . atorvastatin (LIPITOR) 20 MG tablet    Sig: Take 1 tablet (20 mg total) by mouth at bedtime.    Dispense:  90 tablet    Refill:  3    Follow-up: Return in about 3 months (around  01/12/2019) for Diabetes follow-up.    Beatrice Lecher, MD

## 2018-10-13 LAB — LIPID PANEL
Cholesterol: 165 mg/dL (ref <200)
HDL: 40 mg/dL (ref >=40)
LDL Cholesterol (Calc): 102 mg/dL (calc) — ABNORMAL HIGH
Non-HDL Cholesterol (Calc): 125 mg/dL (calc) (ref <130)
Total CHOL/HDL Ratio: 4.1 (calc) (ref <5.0)
Triglycerides: 135 mg/dL (ref <150)

## 2018-10-13 LAB — BASIC METABOLIC PANEL WITH GFR
BUN: 9 mg/dL (ref 7–25)
CO2: 26 mmol/L (ref 20–32)
Calcium: 9.5 mg/dL (ref 8.6–10.3)
Chloride: 106 mmol/L (ref 98–110)
Creat: 0.79 mg/dL (ref 0.60–1.35)
GFR, Est African American: 123 mL/min/{1.73_m2} (ref >=60)
GFR, Est Non African American: 106 mL/min/{1.73_m2} (ref >=60)
Glucose, Bld: 108 mg/dL — ABNORMAL HIGH (ref 65–99)
Potassium: 4.8 mmol/L (ref 3.5–5.3)
Sodium: 137 mmol/L (ref 135–146)

## 2018-12-27 ENCOUNTER — Telehealth: Payer: Self-pay | Admitting: Family Medicine

## 2018-12-27 NOTE — Telephone Encounter (Signed)
Pt called. His Doctor in [place] said there is a recall on the Metformin 500/1000mg .  He is Dr. Ernst Bowler patient. Thanks

## 2018-12-27 NOTE — Telephone Encounter (Signed)
Patient contacting pharmacy, will let us know if his lot is effected

## 2018-12-27 NOTE — Telephone Encounter (Signed)
Not all lots are effected. Call pharmacy and see if his lot is a part of the effected recall.

## 2019-01-09 ENCOUNTER — Other Ambulatory Visit: Payer: Self-pay

## 2019-01-09 ENCOUNTER — Encounter: Payer: Self-pay | Admitting: Family Medicine

## 2019-01-09 ENCOUNTER — Ambulatory Visit (INDEPENDENT_AMBULATORY_CARE_PROVIDER_SITE_OTHER): Payer: BC Managed Care – PPO | Admitting: Family Medicine

## 2019-01-09 VITALS — BP 134/80 | HR 80 | Ht 71.0 in | Wt 277.0 lb

## 2019-01-09 DIAGNOSIS — Z6838 Body mass index (BMI) 38.0-38.9, adult: Secondary | ICD-10-CM | POA: Diagnosis not present

## 2019-01-09 DIAGNOSIS — R0683 Snoring: Secondary | ICD-10-CM | POA: Insufficient documentation

## 2019-01-09 DIAGNOSIS — E119 Type 2 diabetes mellitus without complications: Secondary | ICD-10-CM | POA: Diagnosis not present

## 2019-01-09 LAB — POCT GLYCOSYLATED HEMOGLOBIN (HGB A1C): Hemoglobin A1C: 7.5 % — AB (ref 4.0–5.6)

## 2019-01-09 NOTE — Patient Instructions (Signed)
Diabetes Mellitus and Exercise Exercising regularly is important for your overall health, especially when you have diabetes (diabetes mellitus). Exercising is not only about losing weight. It has many other health benefits, such as increasing muscle strength and bone density and reducing body fat and stress. This leads to improved fitness, flexibility, and endurance, all of which result in better overall health. Exercise has additional benefits for people with diabetes, including:  Reducing appetite.  Helping to lower and control blood glucose.  Lowering blood pressure.  Helping to control amounts of fatty substances (lipids) in the blood, such as cholesterol and triglycerides.  Helping the body to respond better to insulin (improving insulin sensitivity).  Reducing how much insulin the body needs.  Decreasing the risk for heart disease by: ? Lowering cholesterol and triglyceride levels. ? Increasing the levels of good cholesterol. ? Lowering blood glucose levels. What is my activity plan? Your health care provider or certified diabetes educator can help you make a plan for the type and frequency of exercise (activity plan) that works for you. Make sure that you:  Do at least 150 minutes of moderate-intensity or vigorous-intensity exercise each week. This could be brisk walking, biking, or water aerobics. ? Do stretching and strength exercises, such as yoga or weightlifting, at least 2 times a week. ? Spread out your activity over at least 3 days of the week.  Get some form of physical activity every day. ? Do not go more than 2 days in a row without some kind of physical activity. ? Avoid being inactive for more than 30 minutes at a time. Take frequent breaks to walk or stretch.  Choose a type of exercise or activity that you enjoy, and set realistic goals.  Start slowly, and gradually increase the intensity of your exercise over time. What do I need to know about managing my  diabetes?   Check your blood glucose before and after exercising. ? If your blood glucose is 240 mg/dL (13.3 mmol/L) or higher before you exercise, check your urine for ketones. If you have ketones in your urine, do not exercise until your blood glucose returns to normal. ? If your blood glucose is 100 mg/dL (5.6 mmol/L) or lower, eat a snack containing 15-20 grams of carbohydrate. Check your blood glucose 15 minutes after the snack to make sure that your level is above 100 mg/dL (5.6 mmol/L) before you start your exercise.  Know the symptoms of low blood glucose (hypoglycemia) and how to treat it. Your risk for hypoglycemia increases during and after exercise. Common symptoms of hypoglycemia can include: ? Hunger. ? Anxiety. ? Sweating and feeling clammy. ? Confusion. ? Dizziness or feeling light-headed. ? Increased heart rate or palpitations. ? Blurry vision. ? Tingling or numbness around the mouth, lips, or tongue. ? Tremors or shakes. ? Irritability.  Keep a rapid-acting carbohydrate snack available before, during, and after exercise to help prevent or treat hypoglycemia.  Avoid injecting insulin into areas of the body that are going to be exercised. For example, avoid injecting insulin into: ? The arms, when playing tennis. ? The legs, when jogging.  Keep records of your exercise habits. Doing this can help you and your health care provider adjust your diabetes management plan as needed. Write down: ? Food that you eat before and after you exercise. ? Blood glucose levels before and after you exercise. ? The type and amount of exercise you have done. ? When your insulin is expected to peak, if you use   insulin. Avoid exercising at times when your insulin is peaking.  When you start a new exercise or activity, work with your health care provider to make sure the activity is safe for you, and to adjust your insulin, medicines, or food intake as needed.  Drink plenty of water while  you exercise to prevent dehydration or heat stroke. Drink enough fluid to keep your urine clear or pale yellow. Summary  Exercising regularly is important for your overall health, especially when you have diabetes (diabetes mellitus).  Exercising has many health benefits, such as increasing muscle strength and bone density and reducing body fat and stress.  Your health care provider or certified diabetes educator can help you make a plan for the type and frequency of exercise (activity plan) that works for you.  When you start a new exercise or activity, work with your health care provider to make sure the activity is safe for you, and to adjust your insulin, medicines, or food intake as needed. This information is not intended to replace advice given to you by your health care provider. Make sure you discuss any questions you have with your health care provider. Document Released: 06/26/2003 Document Revised: 10/28/2016 Document Reviewed: 09/15/2015 Elsevier Patient Education  2020 [employer]  

## 2019-01-09 NOTE — Assessment & Plan Note (Signed)
Discussed continuing to work on weight loss.  He has been more active but he is also been at home more helping to homeschool his son with a learning through [employer] and so admits he is also been eating more.  Just encouraged him to continue to work at it.

## 2019-01-09 NOTE — Assessment & Plan Note (Signed)
A1c was much better last time it is up a little bit up to 7.5.  But he is also been skipping the glipizide some days.  We discussed actually taking a half a day every day and then okay to use a whole tab on days where he is knows he is going to consume a little bit more.  If he feels like that still causing some hypoglycemia that encouraged him to stop it completely and call me back and we can also change medications.  We did discuss that for some people they are very sensitive to the medication we end up taking them off of it.

## 2019-01-09 NOTE — Assessment & Plan Note (Signed)
Stop bang score positive for 5 out of 8 questions.  Significant for possible sleep apnea.  Discussed doing a home sleep study to confirm diagnosis I do think this could be contributing to some of his excess fatigue that he is experiencing.

## 2019-01-09 NOTE — Progress Notes (Signed)
Established Patient Office Visit  Subjective:  Patient ID: Samanyu Tinnell, male    DOB: December 23, 1970  Age: 48 y.o. MRN: [id number]  CC:  Chief Complaint  Patient presents with  . Diabetes  . Sleep Apnea    HPI [place] presents for  Diabetes -he did have 2 episodes of hypoglycemia.  One was a sugar went to the 30s and 1 where went to the 40s.  He drank juice both times to bring it back up.  He said he had not eaten much but vegetables and take his medication when it happened.  No wounds or sores that are not healing well. No increased thirst or urination. Checking glucose at home. Taking medications as prescribed without any side effects. The the glipizide tends to cause hypoglycemia so he only takes it as needed.  He says if he knows he is been eating a lot more he will take it and if he does not he will take the medication so on average she is taking it about 3 days.  He has had a hard time losing weight.  He also complains of feeling more tired than usual.  He says sometimes will do some activities and then just feel completely exhausted afterwards.  He does report that he snores.  He says his wife has commented on it several times he says he also moves a lot in his sleep to the point that she gets up and sleeps elsewhere besides the bedroom.    No past medical history on file.  No past surgical history on file.  Family History  Problem Relation Age of Onset  . Thyroid disease Neg Hx     Social History   Socioeconomic History  . Marital status: Married    Spouse name: Not on file  . Number of children: Not on file  . Years of education: Not on file  . Highest education level: Not on file  Occupational History  . Not on file  Social Needs  . Financial resource strain: Not on file  . Food insecurity    Worry: Not on file    Inability: Not on file  . Transportation needs    Medical: Not on file    Non-medical: Not on file  Tobacco Use  .  Smoking status: Former Smoker    Quit date: 04/20/2016    Years since quitting: 2.7  . Smokeless tobacco: Never Used  . Tobacco comment: 1-2 cigs QD  Substance and Sexual Activity  . Alcohol use: Yes    Alcohol/week: 1.0 standard drinks    Types: 1 Cans of beer per week    Comment: rarely  . Drug use: No  . Sexual activity: Not on file  Lifestyle  . Physical activity    Days per week: Not on file    Minutes per session: Not on file  . Stress: Not on file  Relationships  . Social Herbalist on phone: Not on file    Gets together: Not on file    Attends religious service: Not on file    Active member of club or organization: Not on file    Attends meetings of clubs or organizations: Not on file    Relationship status: Not on file  . Intimate partner violence    Fear of current or ex partner: Not on file    Emotionally abused: Not on file    Physically abused: Not on file  Forced sexual activity: Not on file  Other Topics Concern  . Not on file  Social History Narrative  . Not on file    Outpatient Medications Prior to Visit  Medication Sig Dispense Refill  . atorvastatin (LIPITOR) 20 MG tablet Take 1 tablet (20 mg total) by mouth at bedtime. 90 tablet 3  . blood glucose meter kit and supplies Dispense based on patient and insurance preference. Use up to four times daily as directed. Dx:E11.65 1 each 0  . glipiZIDE (GLUCOTROL) 5 MG tablet Take 1 tablet (5 mg total) by mouth 2 (two) times daily before a meal. 180 tablet 1  . glucose blood test strip Use for testing blood sugar four times daily.E11.65 900 each 4  . metFORMIN (GLUCOPHAGE) 1000 MG tablet Take 1 tablet (1,000 mg total) by mouth 2 (two) times daily with a meal. 180 tablet 1  . OneTouch Delica Lancets 15Q MISC USE TO CHECK BLOOD SUGAR FOUR TIMES DAILY AS DIRECTED. E11.65 1200 each 4   No facility-administered medications prior to visit.     No Known Allergies  ROS Review of Systems    Objective:     Physical Exam  Constitutional: He is oriented to person, place, and time. He appears well-developed and well-nourished.  HENT:  Head: Normocephalic and atraumatic.  Cardiovascular: Normal rate, regular rhythm and normal heart sounds.  Pulmonary/Chest: Effort normal and breath sounds normal.  Neurological: He is alert and oriented to person, place, and time.  Skin: Skin is warm and dry.  Psychiatric: He has a normal mood and affect. His behavior is normal.    BP 134/80   Pulse 80   Ht _0  (1.803 m)   Wt 277 lb (125.6 kg)   SpO2 99%   BMI 38.63 kg/m  Wt Readings from Last 3 Encounters:  01/09/19 277 lb (125.6 kg)  10/12/18 270 lb (122.5 kg)  07/17/18 266 lb (120.7 kg)     There are no preventive care reminders to display for this patient.  There are no preventive care reminders to display for this patient.  Lab Results  Component Value Date   TSH 1.96 06/02/2018   Lab Results  Component Value Date   WBC 9.4 06/02/2018   HGB 14.8 06/02/2018   HCT 44.0 06/02/2018   MCV 91.1 06/02/2018   PLT 307 06/02/2018   Lab Results  Component Value Date   NA 137 10/12/2018   K 4.8 10/12/2018   CO2 26 10/12/2018   GLUCOSE 108 (H) 10/12/2018   BUN 9 10/12/2018   CREATININE 0.79 10/12/2018   BILITOT 0.4 06/02/2018   AST 41 (H) 06/02/2018   ALT 56 (H) 06/02/2018   PROT 7.3 06/02/2018   CALCIUM 9.5 10/12/2018   Lab Results  Component Value Date   CHOL 165 10/12/2018   Lab Results  Component Value Date   HDL 40 10/12/2018   Lab Results  Component Value Date   LDLCALC 102 (H) 10/12/2018   Lab Results  Component Value Date   TRIG 135 10/12/2018   Lab Results  Component Value Date   CHOLHDL 4.1 10/12/2018   Lab Results  Component Value Date   HGBA1C 7.5 (A) 01/09/2019      Assessment & Plan:   Problem List Items Addressed This Visit      Endocrine   Controlled type 2 diabetes mellitus without complication, without long-term current use of insulin  (HCC) - Primary    A1c was much better last time it  is up a little bit up to 7.5.  But he is also been skipping the glipizide some days.  We discussed actually taking a half a day every day and then okay to use a whole tab on days where he is knows he is going to consume a little bit more.  If he feels like that still causing some hypoglycemia that encouraged him to stop it completely and call me back and we can also change medications.  We did discuss that for some people they are very sensitive to the medication we end up taking them off of it.      Relevant Orders   POCT glycosylated hemoglobin (Hb A1C) (Completed)     Other   Snoring    Stop bang score positive for 5 out of 8 questions.  Significant for possible sleep apnea.  Discussed doing a home sleep study to confirm diagnosis I do think this could be contributing to some of his excess fatigue that he is experiencing.      Relevant Orders   Home sleep test   BMI 38.0-38.9,adult    Discussed continuing to work on weight loss.  He has been more active but he is also been at home more helping to homeschool his son with a learning through [employer] and so admits he is also been eating more.  Just encouraged him to continue to work at it.         No orders of the defined types were placed in this encounter.   Follow-up: Return in about 3 months (around 04/10/2019) for Diabetes follow-up.    Beatrice Lecher, MD

## 2019-01-12 ENCOUNTER — Ambulatory Visit: Payer: BC Managed Care – PPO | Admitting: Family Medicine

## 2019-02-05 DIAGNOSIS — E119 Type 2 diabetes mellitus without complications: Secondary | ICD-10-CM | POA: Diagnosis not present

## 2019-02-05 LAB — HM DIABETES EYE EXAM

## 2019-03-01 ENCOUNTER — Encounter: Payer: Self-pay | Admitting: Family Medicine

## 2019-04-10 ENCOUNTER — Other Ambulatory Visit: Payer: Self-pay

## 2019-04-10 ENCOUNTER — Ambulatory Visit (INDEPENDENT_AMBULATORY_CARE_PROVIDER_SITE_OTHER): Payer: BC Managed Care – PPO | Admitting: Family Medicine

## 2019-04-10 ENCOUNTER — Encounter: Payer: Self-pay | Admitting: Family Medicine

## 2019-04-10 VITALS — BP 132/74 | HR 85 | Ht 71.0 in | Wt 276.0 lb

## 2019-04-10 DIAGNOSIS — E119 Type 2 diabetes mellitus without complications: Secondary | ICD-10-CM

## 2019-04-10 DIAGNOSIS — K429 Umbilical hernia without obstruction or gangrene: Secondary | ICD-10-CM

## 2019-04-10 LAB — POCT GLYCOSYLATED HEMOGLOBIN (HGB A1C): Hemoglobin A1C: 6.4 % — AB (ref 4.0–5.6)

## 2019-04-10 LAB — POCT UA - MICROALBUMIN
Creatinine, POC: 300 mg/dL
Microalbumin Ur, POC: 30 mg/L

## 2019-04-10 NOTE — Assessment & Plan Note (Signed)
Discussed diagnosis.  Recommend at this point that he consider surgical correction.  Especially if it is becoming more tender and sore at times.  He definitely has an umbilical hernia on exam but also wondering if he has some thinness and weakening of the wall around the umbilicus.  We will go ahead and make referral to [facility] surgery in [place].  Gust warning signs and symptoms to go to the emergency department if needed.

## 2019-04-10 NOTE — Progress Notes (Signed)
Established Patient Office Visit  Subjective:  Patient ID: Samuel Cherry, male    DOB: 1970/06/23  Age: 48 y.o. MRN: [id number]  CC:  Chief Complaint  Patient presents with  . Diabetes  . Umbilical Hernia    HPI Samuel Cherry presents for   Diabetes - no hypoglycemic events. No wounds or sores that are not healing well. No increased thirst or urination. Checking glucose at home. Taking medications as prescribed without any side effects. He has been eating more vegetables.  He occasionally spits the glipizide in half if he is not can eat a lot as before it was causing some hypoglycemic events.  He has been getting some pain in the periumbilical area for about a month.  Has been doing a lot of heavy lifting getting up Christmas decorations.  He said it is gotten swollen and sore in his bellybutton.  He is reassured that he has a hernia.  He has had times raise had to go lay down and press it back in and usually takes about an hour him and he starts to feel better and get relief.  History reviewed. No pertinent past medical history.  History reviewed. No pertinent surgical history.  Family History  Problem Relation Age of Onset  . Thyroid disease Neg Hx     Social History   Socioeconomic History  . Marital status: Married    Spouse name: Not on file  . Number of children: Not on file  . Years of education: Not on file  . Highest education level: Not on file  Occupational History  . Not on file  Tobacco Use  . Smoking status: Former Smoker    Quit date: 04/20/2016    Years since quitting: 2.9  . Smokeless tobacco: Never Used  . Tobacco comment: 1-2 cigs QD  Substance and Sexual Activity  . Alcohol use: Yes    Alcohol/week: 1.0 standard drinks    Types: 1 Cans of beer per week    Comment: rarely  . Drug use: No  . Sexual activity: Not on file  Other Topics Concern  . Not on file  Social History Narrative  . Not on file   Social Determinants  of Health   Financial Resource Strain:   . Difficulty of Paying Living Expenses: Not on file  Food Insecurity:   . Worried About Charity fundraiser in the Last Year: Not on file  . Ran Out of Food in the Last Year: Not on file  Transportation Needs:   . Lack of Transportation (Medical): Not on file  . Lack of Transportation (Non-Medical): Not on file  Physical Activity:   . Days of Exercise per Week: Not on file  . Minutes of Exercise per Session: Not on file  Stress:   . Feeling of Stress : Not on file  Social Connections:   . Frequency of Communication with Friends and Family: Not on file  . Frequency of Social Gatherings with Friends and Family: Not on file  . Attends Religious Services: Not on file  . Active Member of Clubs or Organizations: Not on file  . Attends Archivist Meetings: Not on file  . Marital Status: Not on file  Intimate Partner Violence:   . Fear of Current or Ex-Partner: Not on file  . Emotionally Abused: Not on file  . Physically Abused: Not on file  . Sexually Abused: Not on file    Outpatient Medications Prior to Visit  Medication Sig Dispense Refill  . atorvastatin (LIPITOR) 20 MG tablet Take 1 tablet (20 mg total) by mouth at bedtime. 90 tablet 3  . blood glucose meter kit and supplies Dispense based on patient and insurance preference. Use up to four times daily as directed. Dx:E11.65 1 each 0  . glipiZIDE (GLUCOTROL) 5 MG tablet Take 1 tablet (5 mg total) by mouth 2 (two) times daily before a meal. 180 tablet 1  . glucose blood test strip Use for testing blood sugar four times daily.E11.65 900 each 4  . metFORMIN (GLUCOPHAGE) 1000 MG tablet Take 1 tablet (1,000 mg total) by mouth 2 (two) times daily with a meal. 180 tablet 1  . OneTouch Delica Lancets 38O MISC USE TO CHECK BLOOD SUGAR FOUR TIMES DAILY AS DIRECTED. E11.65 1200 each 4   No facility-administered medications prior to visit.    No Known Allergies  ROS Review of  Systems    Objective:    Physical Exam  Constitutional: He is oriented to person, place, and time. He appears well-developed and well-nourished.  HENT:  Head: Normocephalic and atraumatic.  Cardiovascular: Normal rate, regular rhythm and normal heart sounds.  Pulmonary/Chest: Effort normal and breath sounds normal.  Neurological: He is alert and oriented to person, place, and time.  Skin: Skin is warm and dry.  Well-defined umbilical hernia approximately 3-1/2 cm across.  It is easily reducible.  And he does have a little bit of bulging of the wall to the right of the umbilicus which is little concerning for some weakened wall.  Psychiatric: He has a normal mood and affect. His behavior is normal.    BP 132/74   Pulse 85   Ht '5\' 11"'  (1.803 m)   Wt 276 lb (125.2 kg)   SpO2 98%   BMI 38.49 kg/m  Wt Readings from Last 3 Encounters:  04/10/19 276 lb (125.2 kg)  01/09/19 277 lb (125.6 kg)  10/12/18 270 lb (122.5 kg)     There are no preventive care reminders to display for this patient.  There are no preventive care reminders to display for this patient.  Lab Results  Component Value Date   TSH 1.96 06/02/2018   Lab Results  Component Value Date   WBC 9.4 06/02/2018   HGB 14.8 06/02/2018   HCT 44.0 06/02/2018   MCV 91.1 06/02/2018   PLT 307 06/02/2018   Lab Results  Component Value Date   NA 137 10/12/2018   K 4.8 10/12/2018   CO2 26 10/12/2018   GLUCOSE 108 (H) 10/12/2018   BUN 9 10/12/2018   CREATININE 0.79 10/12/2018   BILITOT 0.4 06/02/2018   AST 41 (H) 06/02/2018   ALT 56 (H) 06/02/2018   PROT 7.3 06/02/2018   CALCIUM 9.5 10/12/2018   Lab Results  Component Value Date   CHOL 165 10/12/2018   Lab Results  Component Value Date   HDL 40 10/12/2018   Lab Results  Component Value Date   LDLCALC 102 (H) 10/12/2018   Lab Results  Component Value Date   TRIG 135 10/12/2018   Lab Results  Component Value Date   CHOLHDL 4.1 10/12/2018   Lab  Results  Component Value Date   HGBA1C 6.4 (A) 04/10/2019      Assessment & Plan:   Problem List Items Addressed This Visit      Endocrine   Controlled type 2 diabetes mellitus without complication, without long-term current use of insulin ([place]) - Primary    AC1  looks great 6.4.  Looks great.  T current regimen.  Follow-up in 4 months.      Relevant Orders   POCT HgB A1C (Completed)   POCT UA - Microalbumin (Completed)     Other   Umbilical hernia without obstruction and without gangrene    Discussed diagnosis.  Recommend at this point that he consider surgical correction.  Especially if it is becoming more tender and sore at times.  He definitely has an umbilical hernia on exam but also wondering if he has some thinness and weakening of the wall around the umbilicus.  We will go ahead and make referral to [facility] surgery in [place].  Gust warning signs and symptoms to go to the emergency department if needed.      Relevant Orders   Ambulatory referral to General Surgery      No orders of the defined types were placed in this encounter.   Follow-up: Return in about 4 months (around 08/09/2019) for Diabetes follow-up.    Beatrice Lecher, MD

## 2019-04-10 NOTE — Assessment & Plan Note (Signed)
AC1 looks great 6.4.  Looks great.  T current regimen.  Follow-up in 4 months.

## 2019-05-04 DIAGNOSIS — K429 Umbilical hernia without obstruction or gangrene: Secondary | ICD-10-CM | POA: Diagnosis not present

## 2019-08-09 ENCOUNTER — Ambulatory Visit: Payer: BC Managed Care – PPO | Admitting: Family Medicine

## 2019-08-09 NOTE — Progress Notes (Deleted)
Established Patient Office Visit  Subjective:  Patient ID: Samuel Cherry, male    DOB: 11-06-1970  Age: 49 y.o. MRN: [id number]  CC: No chief complaint on file.   HPI Samuel Cherry presents for   Diabetes - no hypoglycemic events. No wounds or sores that are not healing well. No increased thirst or urination. Checking glucose at home. Taking medications as prescribed without any side effects.   No past medical history on file.  No past surgical history on file.  Family History  Problem Relation Age of Onset  . Thyroid disease Neg Hx     Social History   Socioeconomic History  . Marital status: Married    Spouse name: Not on file  . Number of children: Not on file  . Years of education: Not on file  . Highest education level: Not on file  Occupational History  . Not on file  Tobacco Use  . Smoking status: Former Smoker    Quit date: 04/20/2016    Years since quitting: 3.3  . Smokeless tobacco: Never Used  . Tobacco comment: 1-2 cigs QD  Substance and Sexual Activity  . Alcohol use: Yes    Alcohol/week: 1.0 standard drinks    Types: 1 Cans of beer per week    Comment: rarely  . Drug use: No  . Sexual activity: Not on file  Other Topics Concern  . Not on file  Social History Narrative  . Not on file   Social Determinants of Health   Financial Resource Strain:   . Difficulty of Paying Living Expenses:   Food Insecurity:   . Worried About Charity fundraiser in the Last Year:   . Arboriculturist in the Last Year:   Transportation Needs:   . Film/video editor (Medical):   Marland Kitchen Lack of Transportation (Non-Medical):   Physical Activity:   . Days of Exercise per Week:   . Minutes of Exercise per Session:   Stress:   . Feeling of Stress :   Social Connections:   . Frequency of Communication with Friends and Family:   . Frequency of Social Gatherings with Friends and Family:   . Attends Religious Services:   . Active Member of Clubs  or Organizations:   . Attends Archivist Meetings:   Marland Kitchen Marital Status:   Intimate Partner Violence:   . Fear of Current or Ex-Partner:   . Emotionally Abused:   Marland Kitchen Physically Abused:   . Sexually Abused:     Outpatient Medications Prior to Visit  Medication Sig Dispense Refill  . atorvastatin (LIPITOR) 20 MG tablet Take 1 tablet (20 mg total) by mouth at bedtime. 90 tablet 3  . blood glucose meter kit and supplies Dispense based on patient and insurance preference. Use up to four times daily as directed. Dx:E11.65 1 each 0  . glipiZIDE (GLUCOTROL) 5 MG tablet Take 1 tablet (5 mg total) by mouth 2 (two) times daily before a meal. 180 tablet 1  . glucose blood test strip Use for testing blood sugar four times daily.E11.65 900 each 4  . metFORMIN (GLUCOPHAGE) 1000 MG tablet Take 1 tablet (1,000 mg total) by mouth 2 (two) times daily with a meal. 180 tablet 1  . OneTouch Delica Lancets 74Y MISC USE TO CHECK BLOOD SUGAR FOUR TIMES DAILY AS DIRECTED. E11.65 1200 each 4   No facility-administered medications prior to visit.    No Known Allergies  ROS Review  of Systems    Objective:    Physical Exam  There were no vitals taken for this visit. Wt Readings from Last 3 Encounters:  04/10/19 276 lb (125.2 kg)  01/09/19 277 lb (125.6 kg)  10/12/18 270 lb (122.5 kg)     Health Maintenance Due  Topic Date Due  . COVID-19 Vaccine (1) Never done    There are no preventive care reminders to display for this patient.  Lab Results  Component Value Date   TSH 1.96 06/02/2018   Lab Results  Component Value Date   WBC 9.4 06/02/2018   HGB 14.8 06/02/2018   HCT 44.0 06/02/2018   MCV 91.1 06/02/2018   PLT 307 06/02/2018   Lab Results  Component Value Date   NA 137 10/12/2018   K 4.8 10/12/2018   CO2 26 10/12/2018   GLUCOSE 108 (H) 10/12/2018   BUN 9 10/12/2018   CREATININE 0.79 10/12/2018   BILITOT 0.4 06/02/2018   AST 41 (H) 06/02/2018   ALT 56 (H) 06/02/2018    PROT 7.3 06/02/2018   CALCIUM 9.5 10/12/2018   Lab Results  Component Value Date   CHOL 165 10/12/2018   Lab Results  Component Value Date   HDL 40 10/12/2018   Lab Results  Component Value Date   LDLCALC 102 (H) 10/12/2018   Lab Results  Component Value Date   TRIG 135 10/12/2018   Lab Results  Component Value Date   CHOLHDL 4.1 10/12/2018   Lab Results  Component Value Date   HGBA1C 6.4 (A) 04/10/2019      Assessment & Plan:   Problem List Items Addressed This Visit      Endocrine   Controlled type 2 diabetes mellitus without complication, without long-term current use of insulin ([place]) - Primary      No orders of the defined types were placed in this encounter.   Follow-up: No follow-ups on file.    Beatrice Lecher, MD

## 2019-08-26 ENCOUNTER — Other Ambulatory Visit: Payer: Self-pay | Admitting: Family Medicine

## 2019-08-26 DIAGNOSIS — E1165 Type 2 diabetes mellitus with hyperglycemia: Secondary | ICD-10-CM

## 2019-12-25 LAB — BASIC METABOLIC PANEL: Glucose: 177

## 2019-12-25 LAB — LIPID PANEL
Cholesterol: 162 (ref 0–200)
HDL: 35 (ref 35–70)
LDL Cholesterol: 99
Triglycerides: 160 (ref 40–160)

## 2019-12-25 LAB — HEMOGLOBIN A1C: Hemoglobin A1C: 7.5

## 2020-03-07 ENCOUNTER — Other Ambulatory Visit: Payer: Self-pay

## 2020-03-07 ENCOUNTER — Ambulatory Visit (INDEPENDENT_AMBULATORY_CARE_PROVIDER_SITE_OTHER): Payer: BC Managed Care – PPO | Admitting: Family Medicine

## 2020-03-07 ENCOUNTER — Encounter: Payer: Self-pay | Admitting: Family Medicine

## 2020-03-07 VITALS — BP 136/87 | HR 84 | Ht 71.0 in | Wt 280.0 lb

## 2020-03-07 DIAGNOSIS — R748 Abnormal levels of other serum enzymes: Secondary | ICD-10-CM | POA: Diagnosis not present

## 2020-03-07 DIAGNOSIS — Z1211 Encounter for screening for malignant neoplasm of colon: Secondary | ICD-10-CM | POA: Diagnosis not present

## 2020-03-07 DIAGNOSIS — E118 Type 2 diabetes mellitus with unspecified complications: Secondary | ICD-10-CM | POA: Insufficient documentation

## 2020-03-07 DIAGNOSIS — E119 Type 2 diabetes mellitus without complications: Secondary | ICD-10-CM | POA: Diagnosis not present

## 2020-03-07 DIAGNOSIS — E1165 Type 2 diabetes mellitus with hyperglycemia: Secondary | ICD-10-CM | POA: Insufficient documentation

## 2020-03-07 DIAGNOSIS — Z6838 Body mass index (BMI) 38.0-38.9, adult: Secondary | ICD-10-CM

## 2020-03-07 MED ORDER — BLOOD GLUCOSE METER KIT: PACK | 0 refills | Status: DC

## 2020-03-07 MED ORDER — ATORVASTATIN CALCIUM 20 MG PO TABS: 20.0000 mg | ORAL_TABLET | Freq: Every day | ORAL | 3 refills | Status: DC

## 2020-03-07 NOTE — Assessment & Plan Note (Addendum)
Uncontrolled. Work on adding protein back into the diet midday since has been only eating twice a day.  F/U in 3 months.foot exam performed today.  Discussed that the glipizide could be causing hypoglycemia.  He doesn't want to change meds today but will think about it.   

## 2020-03-07 NOTE — Assessment & Plan Note (Signed)
Due to recheck liver enzymes. 

## 2020-03-07 NOTE — Progress Notes (Signed)
Established Patient Office Visit  Subjective:  Patient ID: Samuel Cherry, male    DOB: [birth year]  Age: 49 y.o. MRN: [id number]  CC:  Chief Complaint  Patient presents with   Diabetes    [place] [facility] fax: [phone]    HPI Samuel Cherry presents for   Diabetes - no hypoglycemic events. No wounds or sores that are not healing well. No increased thirst or urination. Checking glucose at home. Taking medications as prescribed without any side effects. Taking his medication except the statin.  Only eats about twice a day.  Has had a few hypoglycemic events.    He is also having difficulty with his glucometer.  He says he can check it and then check it again 10 seconds later and it can be as much as 70-80 points different even checking it on a different finger.  History reviewed. No pertinent past medical history.  History reviewed. No pertinent surgical history.  Family History  Problem Relation Age of Onset   Thyroid disease Neg Hx     Social History   Socioeconomic History   Marital status: Married    Spouse name: Not on file   Number of children: Not on file   Years of education: Not on file   Highest education level: Not on file  Occupational History   Not on file  Tobacco Use   Smoking status: Former Smoker    Quit date: 04/20/2016    Years since quitting: 3.8   Smokeless tobacco: Never Used   Tobacco comment: 1-2 cigs QD  Substance and Sexual Activity   Alcohol use: Yes    Alcohol/week: 1.0 standard drink    Types: 1 Cans of beer per week    Comment: rarely   Drug use: No   Sexual activity: Not on file  Other Topics Concern   Not on file  Social History Narrative   Not on file   Social Determinants of Health   Financial Resource Strain:    Difficulty of Paying Living Expenses: Not on file  Food Insecurity:    Worried About [place] in the Last Year: Not on file   Ran Out of Food in the Last Year: Not on  file  Transportation Needs:    Lack of Transportation (Medical): Not on file   Lack of Transportation (Non-Medical): Not on file  Physical Activity:    Days of Exercise per Week: Not on file   Minutes of Exercise per Session: Not on file  Stress:    Feeling of Stress : Not on file  Social Connections:    Frequency of Communication with Friends and Family: Not on file   Frequency of Social Gatherings with Friends and Family: Not on file   Attends Religious Services: Not on file   Active Member of Clubs or Organizations: Not on file   Attends Archivist Meetings: Not on file   Marital Status: Not on file  Intimate Partner Violence:    Fear of Current or Ex-Partner: Not on file   Emotionally Abused: Not on file   Physically Abused: Not on file   Sexually Abused: Not on file    Outpatient Medications Prior to Visit  Medication Sig Dispense Refill   glipiZIDE (GLUCOTROL) 5 MG tablet TAKE 1 TABLET TWICE A DAY BEFORE MEALS 180 tablet 3   metFORMIN (GLUCOPHAGE) 1000 MG tablet TAKE 1 TABLET TWICE A DAY WITH MEALS 180 tablet 3   atorvastatin (  LIPITOR) 20 MG tablet Take 1 tablet (20 mg total) by mouth at bedtime. 90 tablet 3   blood glucose meter kit and supplies Dispense based on patient and insurance preference. Use up to four times daily as directed. Dx:E11.65 1 each 0   glucose blood test strip Use for testing blood sugar four times daily.E11.65 900 each 4   OneTouch Delica Lancets 27C MISC USE TO CHECK BLOOD SUGAR FOUR TIMES DAILY AS DIRECTED. E11.65 1200 each 4   No facility-administered medications prior to visit.    No Known Allergies  ROS Review of Systems    Objective:    Physical Exam Constitutional:      Appearance: He is well-developed.  HENT:     Head: Normocephalic and atraumatic.  Cardiovascular:     Rate and Rhythm: Normal rate and regular rhythm.     Heart sounds: Normal heart sounds.  Pulmonary:     Effort: Pulmonary effort is  normal.     Breath sounds: Normal breath sounds.  Skin:    General: Skin is warm and dry.  Neurological:     Mental Status: He is alert and oriented to person, place, and time.  Psychiatric:        Behavior: Behavior normal.     BP 136/87    Pulse 84    Ht 5' 11" (1.803 m)    Wt 280 lb (127 kg)    SpO2 98%    BMI 39.05 kg/m  Wt Readings from Last 3 Encounters:  03/07/20 280 lb (127 kg)  04/10/19 276 lb (125.2 kg)  01/09/19 277 lb (125.6 kg)     Health Maintenance Due  Topic Date Due   URINE MICROALBUMIN  04/09/2020    There are no preventive care reminders to display for this patient.  Lab Results  Component Value Date   TSH 1.96 06/02/2018   Lab Results  Component Value Date   WBC 9.4 06/02/2018   HGB 14.8 06/02/2018   HCT 44.0 06/02/2018   MCV 91.1 06/02/2018   PLT 307 06/02/2018   Lab Results  Component Value Date   NA 137 10/12/2018   K 4.8 10/12/2018   CO2 26 10/12/2018   GLUCOSE 108 (H) 10/12/2018   BUN 9 10/12/2018   CREATININE 0.79 10/12/2018   BILITOT 0.4 06/02/2018   AST 41 (H) 06/02/2018   ALT 56 (H) 06/02/2018   PROT 7.3 06/02/2018   CALCIUM 9.5 10/12/2018   Lab Results  Component Value Date   CHOL 162 12/25/2019   Lab Results  Component Value Date   HDL 35 12/25/2019   Lab Results  Component Value Date   LDLCALC 99 12/25/2019   Lab Results  Component Value Date   TRIG 160 12/25/2019   Lab Results  Component Value Date   CHOLHDL 4.1 10/12/2018   Lab Results  Component Value Date   HGBA1C 7.5 12/25/2019      Assessment & Plan:   Problem List Items Addressed This Visit      Endocrine   Uncontrolled type 2 diabetes mellitus with hyperglycemia ([place]) - Primary    Uncontrolled. Work on adding protein back into the diet midday since has been only eating twice a day.  F/U in 3 months.foot exam performed today.  Discussed that the glipizide could be causing hypoglycemia.  He doesn't want to change meds today but will think about  it.        Relevant Medications   atorvastatin (LIPITOR) 20 MG tablet  blood glucose meter kit and supplies   Other Relevant Orders   COMPLETE METABOLIC PANEL WITH GFR   RESOLVED: Controlled type 2 diabetes mellitus without complication, without long-term current use of insulin (HCC)    Uncontrolled. Work on adding protein back into the diet midday since has been only eating twice a day.  F/U in 3 months.foot exam performed today.  Discussed that the glipizide could be causing hypoglycemia.  He doesn't want to change meds today but will think about it.        Relevant Medications   atorvastatin (LIPITOR) 20 MG tablet   blood glucose meter kit and supplies     Other   Elevated liver enzymes    Due to recheck liver enzymes      Relevant Orders   COMPLETE METABOLIC PANEL WITH GFR   BMI 38.0-38.9,adult    Continue to work on health diet and weight loss.       Other Visit Diagnoses    Screening for malignant neoplasm of colon       Relevant Orders   Ambulatory referral to Gastroenterology      Meds ordered this encounter  Medications   atorvastatin (LIPITOR) 20 MG tablet    Sig: Take 1 tablet (20 mg total) by mouth at bedtime.    Dispense:  90 tablet    Refill:  3   blood glucose meter kit and supplies    Sig: Dispense based on patient and insurance preference. Use up to four times daily as directed. Dx:E11.65    Dispense:  1 each    Refill:  0    Order Specific Question:   Number of strips    Answer:   200    Order Specific Question:   Number of lancets    Answer:   200    Follow-up: Return in about 3 months (around 06/07/2020) for Diabetes follow-up.    Beatrice Lecher, MD

## 2020-03-07 NOTE — Assessment & Plan Note (Signed)
Continue to work on health diet and weight loss.

## 2020-03-07 NOTE — Assessment & Plan Note (Signed)
Uncontrolled. Work on adding protein back into the diet midday since has been only eating twice a day.  F/U in 3 months.foot exam performed today.  Discussed that the glipizide could be causing hypoglycemia.  He doesn't want to change meds today but will think about it.

## 2020-03-08 LAB — COMPLETE METABOLIC PANEL WITH GFR
AG Ratio: 1.2 (calc) (ref 1.0–2.5)
ALT: 25 U/L (ref 9–46)
AST: 17 U/L (ref 10–40)
Albumin: 4.2 g/dL (ref 3.6–5.1)
Alkaline phosphatase (APISO): 83 U/L (ref 36–130)
BUN: 10 mg/dL (ref 7–25)
CO2: 27 mmol/L (ref 20–32)
Calcium: 10.1 mg/dL (ref 8.6–10.3)
Chloride: 106 mmol/L (ref 98–110)
Creat: 0.82 mg/dL (ref 0.60–1.35)
GFR, Est African American: 120 mL/min/{1.73_m2} (ref >=60)
GFR, Est Non African American: 104 mL/min/{1.73_m2} (ref >=60)
Globulin: 3.6 g/dL (calc) (ref 1.9–3.7)
Glucose, Bld: 117 mg/dL — ABNORMAL HIGH (ref 65–99)
Potassium: 4.4 mmol/L (ref 3.5–5.3)
Sodium: 140 mmol/L (ref 135–146)
Total Bilirubin: 0.4 mg/dL (ref 0.2–1.2)
Total Protein: 7.8 g/dL (ref 6.1–8.1)

## 2020-03-09 NOTE — Progress Notes (Signed)
All labs are normal. 

## 2020-04-15 ENCOUNTER — Other Ambulatory Visit: Payer: Self-pay

## 2020-04-15 ENCOUNTER — Emergency Department (INDEPENDENT_AMBULATORY_CARE_PROVIDER_SITE_OTHER)
Admission: EM | Admit: 2020-04-15 | Discharge: 2020-04-15 | Disposition: A | Discharge status: Home / Self Care | Payer: BC Managed Care – PPO | Source: Home / Self Care

## 2020-04-15 DIAGNOSIS — U071 COVID-19: Secondary | ICD-10-CM | POA: Diagnosis not present

## 2020-04-15 DIAGNOSIS — B9789 Other viral agents as the cause of diseases classified elsewhere: Secondary | ICD-10-CM | POA: Diagnosis not present

## 2020-04-15 DIAGNOSIS — Z20822 Contact with and (suspected) exposure to covid-19: Secondary | ICD-10-CM | POA: Diagnosis not present

## 2020-04-15 DIAGNOSIS — J988 Other specified respiratory disorders: Secondary | ICD-10-CM | POA: Diagnosis not present

## 2020-04-15 NOTE — Discharge Instructions (Signed)
You may share nebulizer and cough medicine prescribed to your spouse.

## 2020-04-15 NOTE — ED Provider Notes (Signed)
[facility]    CSN: [id number] Arrival date & time: 04/15/20  1719      History   Chief Complaint Chief Complaint  Patient presents with  . Fever    HPI Samuel Cherry is a 49 y.o. male.   This is the initial visit for this 49 year old man who comes in tonight with 2 other family members, all of whom have been exposed to Covid and have Covid symptoms. Patient presents to Urgent Care with complaints of intermittent fever since 2 days ago. Patient reports he had a covid exposure 5 days ago, has been taking tylenol and ibuprofen for the fever and it has been helping.       History reviewed. No pertinent past medical history.  Patient Active Problem List   Diagnosis Date Noted  . Uncontrolled type 2 diabetes mellitus with hyperglycemia ([place]) 03/07/2020  . Umbilical hernia without obstruction and without gangrene 04/10/2019  . Snoring 01/09/2019  . Elevated liver enzymes 06/06/2018  . BMI 38.0-38.9,adult 06/06/2018  . Weakness 06/02/2018    History reviewed. No pertinent surgical history.     Home Medications    Prior to Admission medications   Medication Sig Start Date End Date Taking? Authorizing Provider  atorvastatin (LIPITOR) 20 MG tablet Take 1 tablet (20 mg total) by mouth at bedtime. 03/07/20   Hali Marry, MD  blood glucose meter kit and supplies Dispense based on patient and insurance preference. Use up to four times daily as directed. Dx:E11.65 03/07/20   Hali Marry, MD  glipiZIDE (GLUCOTROL) 5 MG tablet TAKE 1 TABLET TWICE A DAY BEFORE MEALS 08/27/19   Hali Marry, MD  metFORMIN (GLUCOPHAGE) 1000 MG tablet TAKE 1 TABLET TWICE A DAY WITH MEALS 08/27/19   Hali Marry, MD    Family History Family History  Problem Relation Age of Onset  . Cancer Father   . Thyroid disease Neg Hx     Social History Social History   Tobacco Use  . Smoking status: Former Smoker    Quit date: 04/20/2016     Years since quitting: 3.9  . Smokeless tobacco: Never Used  . Tobacco comment: 1-2 cigs QD  Substance Use Topics  . Alcohol use: Not Currently    Alcohol/week: 1.0 standard drink    Types: 1 Cans of beer per week    Comment: rarely  . Drug use: No     Allergies   Patient has no known allergies.   Review of Systems Review of Systems   Physical Exam Triage Vital Signs ED Triage Vitals  Enc Vitals Group     BP 04/15/20 1737 135/85     Pulse Rate 04/15/20 1735 85     Resp 04/15/20 1735 19     Temp 04/15/20 1735 98.5 F (36.9 C)     Temp Source 04/15/20 1735 Oral     SpO2 04/15/20 1735 96 %     Weight 04/15/20 1735 60 lb 6.4 oz (27.4 kg)     Height --      Head Circumference --      Peak Flow --      Pain Score 04/15/20 1735 0     Pain Loc --      Pain Edu? --      Excl. in [place]? --    No data found.  Updated Vital Signs BP (!) 139/94   Pulse (!) 116   Temp 99.8 F (37.7 C) (Oral)   Resp  19   SpO2 97%    Physical Exam Vitals reviewed.  Constitutional:      Appearance: Normal appearance. He is obese. He is not ill-appearing.  HENT:     Head: Normocephalic.     Mouth/Throat:     Mouth: Mucous membranes are moist.     Pharynx: Oropharynx is clear.  Eyes:     Conjunctiva/sclera: Conjunctivae normal.  Cardiovascular:     Rate and Rhythm: Tachycardia present.     Heart sounds: Normal heart sounds.  Pulmonary:     Effort: Pulmonary effort is normal.     Breath sounds: Normal breath sounds.  Musculoskeletal:        General: Normal range of motion.     Cervical back: Normal range of motion and neck supple.  Skin:    General: Skin is warm and dry.  Neurological:     General: No focal deficit present.     Mental Status: He is alert and oriented to person, place, and time.  Psychiatric:        Mood and Affect: Mood normal.        Behavior: Behavior normal.        Thought Content: Thought content normal.      UC Treatments / Results  Labs (all labs  ordered are listed, but only abnormal results are displayed) Labs Reviewed  NOVEL CORONAVIRUS, NAA    EKG   Radiology No results found.  Procedures Procedures (including critical care time)  Medications Ordered in UC Medications - No data to display  Initial Impression / Assessment and Plan / UC Course  I have reviewed the triage vital signs and the nursing notes.  Pertinent labs & imaging results that were available during my care of the patient were reviewed by me and considered in my medical decision making (see chart for details).    Final Clinical Impressions(s) / UC Diagnoses   Final diagnoses:  Encounter for laboratory testing for COVID-19 virus  Viral respiratory illness  COVID-19     Discharge Instructions     You may share nebulizer and cough medicine prescribed to your spouse.    ED Prescriptions    None     PDMP not reviewed this encounter.   Robyn Haber, MD 04/15/20 [phone]

## 2020-04-15 NOTE — ED Triage Notes (Signed)
Patient presents to Urgent Care with complaints of intermittent fever since 2 days ago. Patient reports he had a covid exposure 5 days ago, has been taking tylenol and ibuprofen for the fever and it has been helping.

## 2020-04-16 ENCOUNTER — Telehealth: Payer: Self-pay | Admitting: Family Medicine

## 2020-04-16 NOTE — Telephone Encounter (Signed)
Did a virtual visit with his wife today for Covid positive symptoms.  She reports that he started developing  fever and cough yesterday.  Her symptoms started on the 25th, approximately 4 days ago wife tested positive for COVID. His test is pending. PCR ordered.  Would like to be evaluate for infusion. Not vaccinated.  He is also diabetic which puts him at higher risk.  Is interested in possible antibody infusion.  We will go ahead and place referral.

## 2020-04-17 LAB — NOVEL CORONAVIRUS, NAA: SARS-CoV-2, NAA: DETECTED — AB

## 2020-04-17 LAB — SARS-COV-2, NAA 2 DAY TAT

## 2020-04-23 ENCOUNTER — Telehealth: Payer: Self-pay | Admitting: *Deleted

## 2020-04-23 ENCOUNTER — Telehealth: Payer: Self-pay | Admitting: Family Medicine

## 2020-04-23 ENCOUNTER — Encounter: Payer: Self-pay | Admitting: Osteopathic Medicine

## 2020-04-23 NOTE — Progress Notes (Signed)
Received call from team health re: or concerned about fever. COVID (+) 102.7 down to 101.3 w/ Tylenol. Or has COPD. Vax status not known by nurse. On chart review looks like he's already been referred for possible infusion. Will defer to PCP - please have someone call patient to check up on him.

## 2020-04-23 NOTE — Telephone Encounter (Signed)
Please call patient and check on him today if possible.  Sounds like he still running a high fever and has been Covid positive for a week.

## 2020-04-23 NOTE — Telephone Encounter (Signed)
Patient's wife voiced concern of patient having a fever that fluctuates during the day. Patient's wife states that the patient is not eating much but she has been trying to keep him hydrated. Patient's wife states that the patient experiences nausea at times. Patient's wife advised to alternate between Tylenol and Ibuprofen to reduce fever. Patient's wife advised to try to get the patient to eat bland foods such as crackers, pretzels, soup, apple sauce or boiled starches for decreased appetite. Patient's wife advised to notify PCP if current symptoms do not improve. Patient's wife advised to seek care in the ED or Urgent Care if SOB develops or if symptoms become worse. Patient's wife provided the option for a virtual visit on the [facility] website for recommendations outside of PCPs office hours. Understanding verbalized.

## 2020-04-23 NOTE — Telephone Encounter (Signed)
Patient is feeling better, states his only symptom now is a fever.  Yesterday his fever was 102 and today it is 100.2.

## 2020-04-25 ENCOUNTER — Telehealth: Payer: Self-pay | Admitting: Infectious Diseases

## 2020-04-25 ENCOUNTER — Telehealth (INDEPENDENT_AMBULATORY_CARE_PROVIDER_SITE_OTHER): Payer: BC Managed Care – PPO | Admitting: Physician Assistant

## 2020-04-25 ENCOUNTER — Encounter: Payer: Self-pay | Admitting: Physician Assistant

## 2020-04-25 VITALS — Temp 100.5°F

## 2020-04-25 DIAGNOSIS — J1282 Pneumonia due to coronavirus disease 2019: Secondary | ICD-10-CM | POA: Diagnosis not present

## 2020-04-25 DIAGNOSIS — U071 COVID-19: Secondary | ICD-10-CM

## 2020-04-25 MED ORDER — HYDROCOD POLST-CPM POLST ER 10-8 MG/5ML PO SUER: 5.0000 mL | Freq: Two times a day (BID) | ORAL | 0 refills | Status: DC | PRN

## 2020-04-25 MED ORDER — DEXAMETHASONE 4 MG PO TABS: 4.0000 mg | ORAL_TABLET | Freq: Two times a day (BID) | ORAL | 0 refills | Status: DC

## 2020-04-25 MED ORDER — AZITHROMYCIN 250 MG PO TABS: ORAL_TABLET | ORAL | 0 refills | Status: DC

## 2020-04-25 MED ORDER — IPRATROPIUM-ALBUTEROL 0.5-2.5 (3) MG/3ML IN SOLN: 3.0000 mL | RESPIRATORY_TRACT | 0 refills | Status: DC | PRN

## 2020-04-25 NOTE — Progress Notes (Signed)
Patient ID: Ryzen Deady, male   DOB: 02-19-1971, 50 y.o.   MRN: [id number] .Marland KitchenVirtual Visit via Video Note  I connected with Nickoles Gregori on 04/25/20 at 11:10 AM EST by a video enabled telemedicine application and verified that I am speaking with the correct person using two identifiers.  Location: Patient: home Provider: clinic  .Marland KitchenParticipating in visit:  Patient: amilio zehnder Patient wife: Ivette  Provider: Tandy Gaw PA-C   I discussed the limitations of evaluation and management by telemedicine and the availability of in person appointments. The patient expressed understanding and agreed to proceed.  History of Present Illness: Patient is a 50 year old male with positive covid test and COVID concerns.  He is accompanied by his wife who is extremely worried.  He tested positive on 04/15/2020 when his wife had symptoms of COVID.  He was not symptomatic until a few days later.  He is having low-grade fevers, shortness of breath, cough, diarrhea, headache.  He has taken over-the-counter Mucinex and Tylenol Cold sinus severe.  His breathing seems to be getting worse.  He does have a pulse oximeter and was ranging in the 94% but recently started dropping to 90%.  His cough is becoming more productive and he is becoming more lethargic.  He denies any bilateral leg pain or chest pain.  His wife really wants him to get the monoclonal antibody infusion that she got.  .. Active Ambulatory Problems    Diagnosis Date Noted  . Weakness 06/02/2018  . Elevated liver enzymes 06/06/2018  . BMI 38.0-38.9,adult 06/06/2018  . Snoring 01/09/2019  . Umbilical hernia without obstruction and without gangrene 04/10/2019  . Uncontrolled type 2 diabetes mellitus with hyperglycemia (HCC) 03/07/2020   Resolved Ambulatory Problems    Diagnosis Date Noted  . Controlled type 2 diabetes mellitus without complication, without long-term current use of insulin (HCC) 06/02/2018   No  Additional Past Medical History       Observations/Objective: No acute distress Productive cough No visible labored breathing Pale appearance  .Marland Kitchen Today's Vitals   04/25/20 1029  Temp: (!) 100.5 F (38.1 C)   There is no height or weight on file to calculate BMI.    Assessment and Plan: Marland KitchenMarland KitchenDiagnoses and all orders for this visit:  COVID-19 virus infection -     azithromycin (ZITHROMAX Z-PAK) 250 MG tablet; Take 2 tablets (500 mg) on  Day 1,  followed by 1 tablet (250 mg) once daily on Days 2 through 5. -     ipratropium-albuterol (DUONEB) 0.5-2.5 (3) MG/3ML SOLN; Take 3 mLs by nebulization every 4 (four) hours as needed. -     dexamethasone (DECADRON) 4 MG tablet; Take 1 tablet (4 mg total) by mouth 2 (two) times daily with a meal. -     chlorpheniramine-HYDROcodone (TUSSIONEX) 10-8 MG/5ML SUER; Take 5 mLs by mouth every 12 (twelve) hours as needed.  Pneumonia due to COVID-19 virus -     azithromycin (ZITHROMAX Z-PAK) 250 MG tablet; Take 2 tablets (500 mg) on  Day 1,  followed by 1 tablet (250 mg) once daily on Days 2 through 5. -     ipratropium-albuterol (DUONEB) 0.5-2.5 (3) MG/3ML SOLN; Take 3 mLs by nebulization every 4 (four) hours as needed. -     dexamethasone (DECADRON) 4 MG tablet; Take 1 tablet (4 mg total) by mouth 2 (two) times daily with a meal. -     chlorpheniramine-HYDROcodone (TUSSIONEX) 10-8 MG/5ML SUER; Take 5 mLs by mouth every 12 (twelve)  hours as needed.   Discussed COVID positive symptoms. Pt was not vaccinated.  I am very concerned for COVID pneumonia.  This would not change my plan and thus I am not going to get an x-ray to confirm today.  Patient is on day 10 since positive test and symptoms are worsening.  I am going to treat with dexamethasone, CPAP, albuterol, DuoNeb and cough syrup.  Discussed if O2 sats are not improving or worsening by dropping under 88% he needs to go to ED for further evaluation.  Strongly encourage walking at least once an hour  during the day and 5-10 deep breaths every hour.  Make sure to stay hydrated.  Discussed some common complications of COVID-19 such as blood clots and what to look for.  Patient is out of window to get monoclonal antibodies however wife really thinks he needs these and she is convinced since his symptoms started later than his positive test that he was still qualify.  I did go ahead and send message to infusion clinic to screen. Follow up if not improving or go to ED.    Follow Up Instructions:    I discussed the assessment and treatment plan with the patient. The patient was provided an opportunity to ask questions and all were answered. The patient agreed with the plan and demonstrated an understanding of the instructions.   The patient was advised to call back or seek an in-person evaluation if the symptoms worsen or if the condition fails to improve as anticipated.    Tandy Gaw, PA-C

## 2020-04-25 NOTE — Telephone Encounter (Signed)
Please schedule an appointment for patient next week. Thanks.

## 2020-04-25 NOTE — Progress Notes (Signed)
Pt. Stated has fever started at 101.7  And then 100.6  Took tylenol pt is coughing,nausea, diarrhea. this morning no appetite gave robitussin pm  been sleeping a lot  tested postive  04/17/2020 was exposed by wife.

## 2020-04-25 NOTE — Telephone Encounter (Signed)
His wife reached out to me to try to get him treated with monoclonal Ab for covid infection. He was seen at [facility] on 12/28 with 2d history of fevers at that time and was positive for COVID then. I explained to her that we are not able to offer any treatment for him unfortunately give duration of symptoms are beyond the current 7 day and previous 10 day recommendations. I will reach out to the COVID clinic to see if he can be seen for follow up in symptom management. He had a video visit with his PCP today and still having fevers, which can be a known persistent symptom for COVID, unfortunately.    Rexene Alberts, MSN, NP-C [facility]  [email or web address] Pager: [phone] Office: [phone] [facility] Main Line: [phone]

## 2020-04-29 ENCOUNTER — Ambulatory Visit (INDEPENDENT_AMBULATORY_CARE_PROVIDER_SITE_OTHER): Payer: BC Managed Care – PPO | Admitting: Nurse Practitioner

## 2020-04-29 VITALS — BP 128/80 | HR 120 | Temp 97.5°F | Wt 256.0 lb

## 2020-04-29 DIAGNOSIS — U071 COVID-19: Secondary | ICD-10-CM

## 2020-04-29 DIAGNOSIS — Z8616 Personal history of COVID-19: Secondary | ICD-10-CM | POA: Insufficient documentation

## 2020-04-29 DIAGNOSIS — J9601 Acute respiratory failure with hypoxia: Secondary | ICD-10-CM | POA: Insufficient documentation

## 2020-04-29 NOTE — Addendum Note (Signed)
Addended by: Ivonne Andrew on: 04/29/2020 11:45 AM   Modules accepted: Orders

## 2020-04-29 NOTE — Assessment & Plan Note (Signed)
Stay well hydrated  Stay active  Deep breathing exercises  May take tylenol for fever or pain  May take mucinex twice daily  Note: Patient's O2 sats at 79% on RA upon arrival to office - O2 sats came up to 90% on 6L [place] - will order home O2 - will need continuous O2 at 6L [place] and may wean to keep sats above 90%  Will order home health nursing  Note: Strongly advised patient to go to the ED - offered to call EMS - Patient refused  Follow up:  Follow up in 1 week or sooner if needed

## 2020-04-29 NOTE — Patient Instructions (Signed)
Covid 19:   Stay well hydrated  Stay active  Deep breathing exercises  May take tylenol for fever or pain  May take mucinex twice daily  Note: Patient's O2 sats at 79% on RA upon arrival to office - O2 sats came up to 90% on 6L [place] - will order home O2 - will need continuous O2 at 6L [place] and may wean to keep sats above 90%  Will order home health nursing  Note: Strongly advised patient to go to the ED - offered to call EMS - Patient refused  Follow up:  Follow up in 1 week or sooner if needed

## 2020-04-29 NOTE — Progress Notes (Signed)
_0  ID: Samuel Cherry, male    DOB: [birth year], 50 y.o.   MRN: [id number]  Chief Complaint  Patient presents with  . New Patient (Initial Visit)    COVID +12/28. Received azithromycin and decadron from PCP on 1/7. Patient Oxygen 79% during intake.     Referring provider: Hali Marry, *   50 year old male with history of diabetes and obesity.  HPI Patient presents today for post-COVID care clinic visit/ED follow-up.  Patient was seen in the ED on 04/15/2020.  He was given nothing for treatment only tested for COVID.  Patient states that his PCP did order azithromycin and dexamethasone on 04/25/2018.  Patient has just completed these medications.  Upon arrival to the office patient's O2 sats were at 79% on room air.  Patient was placed on O2 at 6 L and O2 sats came up to 90%.  Patient was urged to go to the ED for further evaluation.  Offered to call EMS.  Patient refused and states that he will not go back to the ED.  We will order home O2 and home health nursing with close follow-up in the office next week.  Patient does have nebulizer treatments at home.  Patient agrees to use oxygen at home.  His wife states that she will check his O2 sats at home.  She states that he is she has been checking his O2 sats at home and they have been between 89 to 93%.  We discussed that this does not sound accurate considering he was 79% on room air in the office today.  We discussed how serious this can be.  Patient continues to refuse to go to the ED. Denies f/c/s, n/v/d, hemoptysis, PND, chest pain or edema.        No Known Allergies  Immunization History  Administered Date(s) Administered  . Pneumococcal Polysaccharide-23 10/12/2018  . Tdap 06/02/2018    History reviewed. No pertinent past medical history.  Tobacco History: Social History   Tobacco Use  Smoking Status Former Smoker  . Quit date: 04/20/2016  . Years since quitting: 4.0  Smokeless Tobacco Never Used   Tobacco Comment   1-2 cigs QD   Counseling given: Yes Comment: 1-2 cigs QD   Outpatient Encounter Medications as of 04/29/2020  Medication Sig  . atorvastatin (LIPITOR) 20 MG tablet Take 1 tablet (20 mg total) by mouth at bedtime.  Marland Kitchen azithromycin (ZITHROMAX Z-PAK) 250 MG tablet Take 2 tablets (500 mg) on  Day 1,  followed by 1 tablet (250 mg) once daily on Days 2 through 5.  . blood glucose meter kit and supplies Dispense based on patient and insurance preference. Use up to four times daily as directed. Dx:E11.65  . chlorpheniramine-HYDROcodone (TUSSIONEX) 10-8 MG/5ML SUER Take 5 mLs by mouth every 12 (twelve) hours as needed.  Marland Kitchen dexamethasone (DECADRON) 4 MG tablet Take 1 tablet (4 mg total) by mouth 2 (two) times daily with a meal.  . glipiZIDE (GLUCOTROL) 5 MG tablet TAKE 1 TABLET TWICE A DAY BEFORE MEALS  . ipratropium-albuterol (DUONEB) 0.5-2.5 (3) MG/3ML SOLN Take 3 mLs by nebulization every 4 (four) hours as needed.  . metFORMIN (GLUCOPHAGE) 1000 MG tablet TAKE 1 TABLET TWICE A DAY WITH MEALS   No facility-administered encounter medications on file as of 04/29/2020.     Review of Systems  Review of Systems  Constitutional: Positive for fever.  HENT: Negative.   Respiratory: Positive for shortness of breath. Negative for cough.   Cardiovascular:  Negative.  Negative for chest pain, palpitations and leg swelling.  Gastrointestinal: Negative.   Allergic/Immunologic: Negative.   Neurological: Negative.   Psychiatric/Behavioral: Negative.        Physical Exam  BP 128/80   Pulse (!) 120   Temp (!) 97.5 F (36.4 C)   Wt 256 lb 0.1 oz (116.1 kg)   SpO2 90%   BMI 35.71 kg/m   Wt Readings from Last 5 Encounters:  04/29/20 256 lb 0.1 oz (116.1 kg)  03/07/20 280 lb (127 kg)  04/10/19 276 lb (125.2 kg)  01/09/19 277 lb (125.6 kg)  10/12/18 270 lb (122.5 kg)     Physical Exam Vitals and nursing note reviewed.  Constitutional:      Appearance: He is well-developed  and well-nourished. He is ill-appearing.  Cardiovascular:     Rate and Rhythm: Regular rhythm. Tachycardia present.  Pulmonary:     Effort: Respiratory distress present.     Comments: diminished Musculoskeletal:     Right lower leg: No edema.     Left lower leg: No edema.  Skin:    General: Skin is warm and dry.  Neurological:     Mental Status: He is alert and oriented to person, place, and time.  Psychiatric:        Mood and Affect: Mood and affect and mood normal.        Behavior: Behavior normal.        Assessment & Plan:   COVID-19 Stay well hydrated  Stay active  Deep breathing exercises  May take tylenol for fever or pain  May take mucinex twice daily  Note: Patient's O2 sats at 79% on RA upon arrival to office - O2 sats came up to 90% on 6L [place] - will order home O2 - will need continuous O2 at 6L [place] and may wean to keep sats above 90%  Will order home health nursing  Note: Strongly advised patient to go to the ED - offered to call EMS - Patient refused  Follow up:  Follow up in 1 week or sooner if needed      Tonya S Nichols, NP 04/29/2020  

## 2020-04-30 ENCOUNTER — Telehealth: Payer: Self-pay | Admitting: Nurse Practitioner

## 2020-04-30 ENCOUNTER — Encounter: Payer: Self-pay | Admitting: Family Medicine

## 2020-04-30 ENCOUNTER — Telehealth (INDEPENDENT_AMBULATORY_CARE_PROVIDER_SITE_OTHER): Payer: BC Managed Care – PPO | Admitting: Family Medicine

## 2020-04-30 DIAGNOSIS — U071 COVID-19: Secondary | ICD-10-CM | POA: Diagnosis not present

## 2020-04-30 DIAGNOSIS — R0902 Hypoxemia: Secondary | ICD-10-CM

## 2020-04-30 MED ORDER — OMEPRAZOLE 40 MG PO CPDR: 40.0000 mg | DELAYED_RELEASE_CAPSULE | Freq: Every morning | ORAL | 0 refills | Status: DC

## 2020-04-30 MED ORDER — DEXAMETHASONE 4 MG PO TABS: 4.0000 mg | ORAL_TABLET | Freq: Two times a day (BID) | ORAL | 0 refills | Status: DC

## 2020-04-30 NOTE — Telephone Encounter (Signed)
Called patient to verify if he has received oxygen tank. Patient wife was on the phone stated they will be delivering in 25 minutes. Advised him they will help with set up and how to use. Advised patient to call us back with any questions or concerns. Patient verbally agreed and understood.

## 2020-04-30 NOTE — Progress Notes (Signed)
Spoke w/pt and he stated that he has not gotten the O2 delivered. This is scheduled for delivery today.  He reports experiencing SOB w/exertion his O2 drops down to 82-85. Once he sits for sometime this goes back up to 89-92.   He denies any f/s/c/n/v. He's taking Robitussin DM during the day and the Tussionex at night.  He notices when he drinks Sprite he expels more mucous and then he will get hiccup/belch. This sometimes happens w/water. (felt that this gives him some heart burn). His appetite is returning.

## 2020-04-30 NOTE — Telephone Encounter (Signed)
Yes , omeprazole sent

## 2020-04-30 NOTE — Telephone Encounter (Signed)
Patient scheduled for an appointment today.

## 2020-04-30 NOTE — Patient Instructions (Addendum)
You can pick up Mucinex pill for the cough .

## 2020-04-30 NOTE — Telephone Encounter (Signed)
Patient scheduled for today

## 2020-04-30 NOTE — Progress Notes (Signed)
Virtual Visit via Video Note  I connected with Samuel Cherry on 04/30/20 at 11:40 AM EST by a video enabled telemedicine application and verified that I am speaking with the correct person using two identifiers.   I discussed the limitations of evaluation and management by telemedicine and the availability of in person appointments. The patient expressed understanding and agreed to proceed.  Patient location: at home  Provider location: in office  Subjective:    CC: COVID19   HPI: Spoke w/pt and he stated that he has not gotten the O2 delivered. This is scheduled for delivery today.  He reports experiencing SOB w/exertion his O2 drops down to 82-85. Once he sits for sometime this goes back up to 89-92.   He denies any f/s/c/n/v. He's taking Robitussin DM during the day and the Tussionex at night.  He notices when he drinks Sprite he expels more mucous and then he will get hiccup/belch. This sometimes happens w/water. (felt that this gives him some heart burn). His appetite is returning. Had a lot of nausea and fever in the beginning. Has lost 25 lbs. Had some really dark dark loose stools int he beginning.   After starting the azithromycin he felt much better. Fever has resolved.    Past medical history, Surgical history, Family history not pertinant except as noted below, Social history, Allergies, and medications have been entered into the medical record, reviewed, and corrections made.   Review of Systems: No fevers, chills, night sweats, weight loss, chest pain, or shortness of breath.   Objective:    General: Speaking clearly in complete sentences without any shortness of breath.  Alert and oriented x3.  Normal judgment. No apparent acute distress.    Impression and Recommendations:    No problem-specific Assessment & Plan notes found for this encounter.  COVID- 19 -he is having significant hypoxemia.  He is having oxygen delivered later today would like for  him to also consider getting a chest x-ray once he gets his oxygen.  If he starts having oxygen levels above 95 he can start to wean the oxygen back if needed did encourage him to sleep with it on.  Also encouraged him to try to spend some time lying on his stomach when he is asleep.  And not over pushing or taxing himself especially with his oxygen dropping with activity level.  Continue symptomatic care.  His wife would like him to have something for the chest congestion so recommended Mucinex.  Sounds like he is also getting some burning sensation in his chest from his neck down to the epigastric area which sounds most consistent with GERD so recommend starting a PPI at least for the next 2 weeks.  He finished up his antibiotic yesterday and finished up his prednisone this morning.  He does feel like they have both been helpful.  So I did go ahead and extend his dexamethasone for about 4 more days.    Time spent in encounter 22 minutes  I discussed the assessment and treatment plan with the patient. The patient was provided an opportunity to ask questions and all were answered. The patient agreed with the plan and demonstrated an understanding of the instructions.   The patient was advised to call back or seek an in-person evaluation if the symptoms worsen or if the condition fails to improve as anticipated.   Nani Gasser, MD

## 2020-05-01 ENCOUNTER — Ambulatory Visit (INDEPENDENT_AMBULATORY_CARE_PROVIDER_SITE_OTHER): Payer: BC Managed Care – PPO

## 2020-05-01 ENCOUNTER — Other Ambulatory Visit: Payer: Self-pay

## 2020-05-01 DIAGNOSIS — U071 COVID-19: Secondary | ICD-10-CM

## 2020-05-01 DIAGNOSIS — R0902 Hypoxemia: Secondary | ICD-10-CM | POA: Diagnosis not present

## 2020-05-01 DIAGNOSIS — J189 Pneumonia, unspecified organism: Secondary | ICD-10-CM | POA: Diagnosis not present

## 2020-05-01 MED ORDER — DOXYCYCLINE HYCLATE 100 MG PO TABS: 100.0000 mg | ORAL_TABLET | Freq: Two times a day (BID) | ORAL | 0 refills | Status: DC

## 2020-05-06 ENCOUNTER — Ambulatory Visit: Payer: BC Managed Care – PPO

## 2020-05-13 NOTE — Telephone Encounter (Signed)
Lets sched next week with me for lung check up. If he can walk without the oxygen dropping below 89% then can stop the oxygen during the day and continue to wear at night until I see him next week

## 2020-05-18 ENCOUNTER — Other Ambulatory Visit: Payer: Self-pay | Admitting: Physician Assistant

## 2020-05-18 DIAGNOSIS — J1282 Pneumonia due to coronavirus disease 2019: Secondary | ICD-10-CM

## 2020-05-18 DIAGNOSIS — U071 COVID-19: Secondary | ICD-10-CM

## 2020-05-20 ENCOUNTER — Encounter: Payer: Self-pay | Admitting: Family Medicine

## 2020-05-20 ENCOUNTER — Other Ambulatory Visit: Payer: Self-pay

## 2020-05-20 ENCOUNTER — Ambulatory Visit (INDEPENDENT_AMBULATORY_CARE_PROVIDER_SITE_OTHER): Payer: BC Managed Care – PPO | Admitting: Family Medicine

## 2020-05-20 VITALS — BP 111/64 | HR 81 | Ht 71.0 in | Wt 254.0 lb

## 2020-05-20 DIAGNOSIS — E1165 Type 2 diabetes mellitus with hyperglycemia: Secondary | ICD-10-CM

## 2020-05-20 DIAGNOSIS — J1282 Pneumonia due to coronavirus disease 2019: Secondary | ICD-10-CM

## 2020-05-20 DIAGNOSIS — J9601 Acute respiratory failure with hypoxia: Secondary | ICD-10-CM | POA: Diagnosis not present

## 2020-05-20 DIAGNOSIS — U071 COVID-19: Secondary | ICD-10-CM

## 2020-05-20 DIAGNOSIS — R634 Abnormal weight loss: Secondary | ICD-10-CM

## 2020-05-20 DIAGNOSIS — M6281 Muscle weakness (generalized): Secondary | ICD-10-CM

## 2020-05-20 LAB — POCT UA - MICROALBUMIN
Albumin/Creatinine Ratio, Urine, POC: <30
Creatinine, POC: 300 mg/dL
Microalbumin Ur, POC: 10 mg/L

## 2020-05-20 NOTE — Patient Instructions (Signed)
Please go for chest xray around 06/01/2020

## 2020-05-20 NOTE — Progress Notes (Signed)
Established Patient Office Visit  Subjective:  Patient ID: Samuel Cherry, male    DOB: 24-Jun-1970  Age: 50 y.o. MRN: [id number]  CC:  Chief Complaint  Patient presents with  . Follow-up    HPI [place] presents for aloe up of COVID-19 pneumonia.  Became quite seriously ill with COVID-19 and required oxygen therapy.  His symptoms initially started at the end of December and ended up going to the emergency department on December 28.  He is unable to receive the monoclonal antibody infusion.  He was unvaccinated.  He has now come off of the oxygen and is doing better he is here today for me to check his lungs. Still oxygen dropping when walking up the stairs at home.  Still struggling with the fatigue.    History reviewed. No pertinent past medical history.  History reviewed. No pertinent surgical history.  Family History  Problem Relation Age of Onset  . Cancer Father   . Thyroid disease Neg Hx     Social History   Socioeconomic History  . Marital status: Married    Spouse name: Not on file  . Number of children: Not on file  . Years of education: Not on file  . Highest education level: Not on file  Occupational History  . Not on file  Tobacco Use  . Smoking status: Former Smoker    Quit date: 04/20/2016    Years since quitting: 4.0  . Smokeless tobacco: Never Used  . Tobacco comment: 1-2 cigs QD  Substance and Sexual Activity  . Alcohol use: Not Currently    Alcohol/week: 1.0 standard drink    Types: 1 Cans of beer per week    Comment: rarely  . Drug use: No  . Sexual activity: Not on file  Other Topics Concern  . Not on file  Social History Narrative  . Not on file   Social Determinants of Health   Financial Resource Strain: Not on file  Food Insecurity: Not on file  Transportation Needs: Not on file  Physical Activity: Not on file  Stress: Not on file  Social Connections: Not on file  Intimate Partner Violence: Not on file     Outpatient Medications Prior to Visit  Medication Sig Dispense Refill  . atorvastatin (LIPITOR) 20 MG tablet Take 1 tablet (20 mg total) by mouth at bedtime. 90 tablet 3  . blood glucose meter kit and supplies Dispense based on patient and insurance preference. Use up to four times daily as directed. Dx:E11.65 1 each 0  . glipiZIDE (GLUCOTROL) 5 MG tablet TAKE 1 TABLET TWICE A DAY BEFORE MEALS 180 tablet 3  . metFORMIN (GLUCOPHAGE) 1000 MG tablet TAKE 1 TABLET TWICE A DAY WITH MEALS 180 tablet 3  . chlorpheniramine-HYDROcodone (TUSSIONEX) 10-8 MG/5ML SUER Take 5 mLs by mouth every 12 (twelve) hours as needed. 70 mL 0  . dexamethasone (DECADRON) 4 MG tablet Take 1 tablet (4 mg total) by mouth 2 (two) times daily with a meal. 8 tablet 0  . doxycycline (VIBRA-TABS) 100 MG tablet Take 1 tablet (100 mg total) by mouth 2 (two) times daily. 20 tablet 0  . ipratropium-albuterol (DUONEB) 0.5-2.5 (3) MG/3ML SOLN Take 3 mLs by nebulization every 4 (four) hours as needed. 360 mL 0  . omeprazole (PRILOSEC) 40 MG capsule Take 1 capsule (40 mg total) by mouth in the morning. 30 capsule 0   No facility-administered medications prior to visit.    No Known Allergies  ROS Review of Systems    Objective:    Physical Exam Constitutional:      Appearance: He is well-developed.  HENT:     Head: Normocephalic and atraumatic.     Right Ear: Tympanic membrane, ear canal and external ear normal.     Left Ear: Tympanic membrane, ear canal and external ear normal.     Nose: Nose normal.     Mouth/Throat:     Mouth: Mucous membranes are moist.     Pharynx: Oropharynx is clear. No posterior oropharyngeal erythema.  Eyes:     Conjunctiva/sclera: Conjunctivae normal.     Pupils: Pupils are equal, round, and reactive to light.  Neck:     Thyroid: No thyromegaly.  Cardiovascular:     Rate and Rhythm: Normal rate.     Heart sounds: Normal heart sounds.  Pulmonary:     Effort: Pulmonary effort is normal.      Breath sounds: Normal breath sounds.  Musculoskeletal:     Cervical back: Neck supple.  Lymphadenopathy:     Cervical: No cervical adenopathy.  Skin:    General: Skin is warm and dry.  Neurological:     Mental Status: He is alert and oriented to person, place, and time.  Psychiatric:        Mood and Affect: Mood normal.     BP 111/64   Pulse 81   Ht '5\' 11"'  (1.803 m)   Wt 254 lb (115.2 kg)   SpO2 97% Comment: 2L  BMI 35.43 kg/m  Wt Readings from Last 3 Encounters:  05/20/20 254 lb (115.2 kg)  04/29/20 256 lb 0.1 oz (116.1 kg)  03/07/20 280 lb (127 kg)     Health Maintenance Due  Topic Date Due  . COLONOSCOPY (Pts 45-81yr Insurance coverage will need to be confirmed)  Never done  . URINE MICROALBUMIN  04/09/2020    There are no preventive care reminders to display for this patient.  Lab Results  Component Value Date   TSH 1.96 06/02/2018   Lab Results  Component Value Date   WBC 9.4 06/02/2018   HGB 14.8 06/02/2018   HCT 44.0 06/02/2018   MCV 91.1 06/02/2018   PLT 307 06/02/2018   Lab Results  Component Value Date   NA 140 03/07/2020   K 4.4 03/07/2020   CO2 27 03/07/2020   GLUCOSE 117 (H) 03/07/2020   BUN 10 03/07/2020   CREATININE 0.82 03/07/2020   BILITOT 0.4 03/07/2020   AST 17 03/07/2020   ALT 25 03/07/2020   PROT 7.8 03/07/2020   CALCIUM 10.1 03/07/2020   Lab Results  Component Value Date   CHOL 162 12/25/2019   Lab Results  Component Value Date   HDL 35 12/25/2019   Lab Results  Component Value Date   LDLCALC 99 12/25/2019   Lab Results  Component Value Date   TRIG 160 12/25/2019   Lab Results  Component Value Date   CHOLHDL 4.1 10/12/2018   Lab Results  Component Value Date   HGBA1C 7.5 12/25/2019      Assessment & Plan:   Problem List Items Addressed This Visit      Respiratory   Acute respiratory failure with hypoxia ([id number] - [facility]   Relevant Orders   DG Chest 2 View     Endocrine   Uncontrolled type 2  diabetes mellitus with hyperglycemia (HCC)   Relevant Orders   POCT UA - Microalbumin     Other   COVID-19  Relevant Orders   DG Chest 2 View    Other Visit Diagnoses    Pneumonia due to COVID-19 virus       Relevant Orders   DG Chest 2 View   Abnormal weight loss       Muscle weakness         Covid pneumonia-fortunately he has recovered quite significantly still struggling with some shortness of breath and fatigue.  Still unable to work.  We discussed strategies of starting to walk more around the house.  For example if he is watching TV show he can get up during commercial breaks and start to walk maybe going back and forth to the mailbox.  And really just working on building back up that stamina and strength.  He was able to pass a 6-minute walk test he was able to keep his oxygen at 90% or above he never dropped below 90% so we discussed starting to wean the oxygen if he wants to keep it for the next couple weeks just to use as needed as he is pushing himself at home and started become more active can certainly keep an eye on it if at that point he has been able to maintain greater than 88% then he can return the oxygen. Will get repeat CXR.    Weight loss-he has lost about 25 pounds since he became ill.  And he just feels like his muscles particularly in his legs just feel weak.  We discussed working on gradually getting his strength and stamina back and he can use his home pulse oximeter to guide him to make sure that he is not pushing himself too much.  I really actually like for him to keep some of the weight off that he is lost if at all possible so just encouraged portion control and eating healthy is a small in turn help with his diabetes.  Diabetes-he is overdue for urine microalbumin so we will get that updated today as well.  He is technically due for repeat A1c but when I have him come in later this month and just make sure he is continuing to recover and we can do a full  diabetic follow-up at that point in time.  No orders of the defined types were placed in this encounter.   Follow-up: Return if symptoms worsen or fail to improve.    I spent 35 minutes on the day of the encounter to include pre-visit record review, face-to-face time with the patient and post visit ordering of test.   Beatrice Lecher, MD

## 2020-05-22 ENCOUNTER — Other Ambulatory Visit: Payer: Self-pay | Admitting: Family Medicine

## 2020-05-30 ENCOUNTER — Encounter: Payer: Self-pay | Admitting: Family Medicine

## 2020-05-31 DIAGNOSIS — U071 COVID-19: Secondary | ICD-10-CM | POA: Diagnosis not present

## 2020-06-05 ENCOUNTER — Other Ambulatory Visit: Payer: Self-pay

## 2020-06-05 ENCOUNTER — Ambulatory Visit (INDEPENDENT_AMBULATORY_CARE_PROVIDER_SITE_OTHER): Payer: BC Managed Care – PPO

## 2020-06-05 DIAGNOSIS — U071 COVID-19: Secondary | ICD-10-CM | POA: Diagnosis not present

## 2020-06-05 DIAGNOSIS — R0600 Dyspnea, unspecified: Secondary | ICD-10-CM

## 2020-06-05 DIAGNOSIS — J1282 Pneumonia due to coronavirus disease 2019: Secondary | ICD-10-CM

## 2020-06-05 DIAGNOSIS — J9601 Acute respiratory failure with hypoxia: Secondary | ICD-10-CM

## 2020-06-09 ENCOUNTER — Ambulatory Visit (INDEPENDENT_AMBULATORY_CARE_PROVIDER_SITE_OTHER): Payer: BC Managed Care – PPO | Admitting: Family Medicine

## 2020-06-09 ENCOUNTER — Other Ambulatory Visit: Payer: Self-pay

## 2020-06-09 ENCOUNTER — Encounter: Payer: Self-pay | Admitting: Family Medicine

## 2020-06-09 VITALS — BP 140/72 | HR 98 | Ht 71.0 in | Wt 252.0 lb

## 2020-06-09 DIAGNOSIS — J9601 Acute respiratory failure with hypoxia: Secondary | ICD-10-CM

## 2020-06-09 DIAGNOSIS — Z6835 Body mass index (BMI) 35.0-35.9, adult: Secondary | ICD-10-CM

## 2020-06-09 DIAGNOSIS — E1165 Type 2 diabetes mellitus with hyperglycemia: Secondary | ICD-10-CM

## 2020-06-09 DIAGNOSIS — R918 Other nonspecific abnormal finding of lung field: Secondary | ICD-10-CM

## 2020-06-09 LAB — POCT GLYCOSYLATED HEMOGLOBIN (HGB A1C): Hemoglobin A1C: 9.5 % — AB (ref 4.0–5.6)

## 2020-06-09 MED ORDER — AMBULATORY NON FORMULARY MEDICATION: 0 refills | Status: DC

## 2020-06-09 NOTE — Assessment & Plan Note (Signed)
See note above.  Plan on CT high resolution chest in 3 to 6 months.

## 2020-06-09 NOTE — Assessment & Plan Note (Signed)
Uncontrolled.  He had held his medication for most of the time that he was ill he was just afraid that he would have hypoglycemia as he just it was eating very irregularly and some days barely ate at all.  He is also been extremely inactive.  But is working on getting his strength back up.  Continue to work on [employer] and daily exercise.  Continue current regimen. Follow up in  3 months

## 2020-06-09 NOTE — Progress Notes (Signed)
Established Patient Office Visit  Subjective:  Patient ID: Samuel Cherry, male    DOB: [birth year]  Age: 50 y.o. MRN: [id number]  CC:  Chief Complaint  Patient presents with  . Diabetes    HPI [place] presents for   Diabetes - no hypoglycemic events. No wounds or sores that are not healing well. No increased thirst or urination. Checking glucose at home. Taking medications as prescribed without any side effects.   He had held his medication for most of the time that he was ill he was just afraid that he would have hypoglycemia as he just it was eating very irregularly and some days barely ate at all.  He is also been extremely inactive.  But is working on getting his strength back up.    History of COVID/shortness of breath-he reports that her shortness of breath is significantly improving.  He has been monitoring his pulse ox and it usually stays around 96 to 97%.  Actually went to [employer] and says with the cart he was actually able to walk around he said he was tired but it was a good tired.  He did a chest x-ray on Friday to follow-up previous abnormalities.  He would like to review those results today as well.    CLINICAL DATA:  COVID positive in December, persistent dyspnea on exertion  EXAM: CHEST - 2 VIEW  COMPARISON:  05/01/2020 chest radiograph.  FINDINGS: Stable cardiomediastinal silhouette with normal heart size. No pneumothorax. No pleural effusion. Moderate patchy and reticular opacities throughout both lungs, most prominent in the lower lungs, slightly improved.  IMPRESSION: Slight improvement in moderate patchy and reticular opacities throughout both lungs, most prominent in the lower lungs. Differential includes slowly resolving COVID-19 infection versus evolving postinflammatory fibrosis. Follow-up high-resolution chest CT study in 3-6 months may be obtained as clinically warranted.   Electronically Signed   By: Ilona Sorrel M.D.   On: 06/06/2020 12:56  History reviewed. No pertinent past medical history.  History reviewed. No pertinent surgical history.  Family History  Problem Relation Age of Onset  . Cancer Father   . Thyroid disease Neg Hx     Social History   Socioeconomic History  . Marital status: Married    Spouse name: Not on file  . Number of children: Not on file  . Years of education: Not on file  . Highest education level: Not on file  Occupational History  . Not on file  Tobacco Use  . Smoking status: Former Smoker    Quit date: 04/20/2016    Years since quitting: 4.1  . Smokeless tobacco: Never Used  . Tobacco comment: 1-2 cigs QD  Substance and Sexual Activity  . Alcohol use: Not Currently    Alcohol/week: 1.0 standard drink    Types: 1 Cans of beer per week    Comment: rarely  . Drug use: No  . Sexual activity: Not on file  Other Topics Concern  . Not on file  Social History Narrative  . Not on file   Social Determinants of Health   Financial Resource Strain: Not on file  Food Insecurity: Not on file  Transportation Needs: Not on file  Physical Activity: Not on file  Stress: Not on file  Social Connections: Not on file  Intimate Partner Violence: Not on file    Outpatient Medications Prior to Visit  Medication Sig Dispense Refill  . atorvastatin (LIPITOR) 20 MG tablet Take 1 tablet (  20 mg total) by mouth at bedtime. 90 tablet 3  . blood glucose meter kit and supplies Dispense based on patient and insurance preference. Use up to four times daily as directed. Dx:E11.65 1 each 0  . glipiZIDE (GLUCOTROL) 5 MG tablet TAKE 1 TABLET TWICE A DAY BEFORE MEALS 180 tablet 3  . metFORMIN (GLUCOPHAGE) 1000 MG tablet TAKE 1 TABLET TWICE A DAY WITH MEALS 180 tablet 3   No facility-administered medications prior to visit.    No Known Allergies  ROS Review of Systems    Objective:    Physical Exam Constitutional:      Appearance: He is well-developed and  well-nourished.  HENT:     Head: Normocephalic and atraumatic.  Cardiovascular:     Rate and Rhythm: Normal rate and regular rhythm.     Heart sounds: Normal heart sounds.  Pulmonary:     Effort: Pulmonary effort is normal.     Breath sounds: Normal breath sounds.  Skin:    General: Skin is warm and dry.  Neurological:     Mental Status: He is alert and oriented to person, place, and time.  Psychiatric:        Mood and Affect: Mood and affect normal.        Behavior: Behavior normal.     BP 140/72   Pulse 98   Ht $R'5\' 11"'uT$  (1.803 m)   Wt 252 lb (114.3 kg)   SpO2 96%   BMI 35.15 kg/m  Wt Readings from Last 3 Encounters:  06/09/20 252 lb (114.3 kg)  05/20/20 254 lb (115.2 kg)  04/29/20 256 lb 0.1 oz (116.1 kg)     Health Maintenance Due  Topic Date Due  . COLONOSCOPY (Pts 45-27yrs Insurance coverage will need to be confirmed)  Never done    There are no preventive care reminders to display for this patient.  Lab Results  Component Value Date   TSH 1.96 06/02/2018   Lab Results  Component Value Date   WBC 9.4 06/02/2018   HGB 14.8 06/02/2018   HCT 44.0 06/02/2018   MCV 91.1 06/02/2018   PLT 307 06/02/2018   Lab Results  Component Value Date   NA 140 03/07/2020   K 4.4 03/07/2020   CO2 27 03/07/2020   GLUCOSE 117 (H) 03/07/2020   BUN 10 03/07/2020   CREATININE 0.82 03/07/2020   BILITOT 0.4 03/07/2020   AST 17 03/07/2020   ALT 25 03/07/2020   PROT 7.8 03/07/2020   CALCIUM 10.1 03/07/2020   Lab Results  Component Value Date   CHOL 162 12/25/2019   Lab Results  Component Value Date   HDL 35 12/25/2019   Lab Results  Component Value Date   LDLCALC 99 12/25/2019   Lab Results  Component Value Date   TRIG 160 12/25/2019   Lab Results  Component Value Date   CHOLHDL 4.1 10/12/2018   Lab Results  Component Value Date   HGBA1C 9.5 (A) 06/09/2020      Assessment & Plan:   Problem List Items Addressed This Visit      Respiratory   Acute  respiratory failure with hypoxia ([place])    Is actually been able to go all week without his oxygen and has been doing well he has been keeping his pulse ox around 96%.  We will send a order to discontinue oxygen.  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  He had held his medication for most of the time that he was ill he was just afraid that he would have hypoglycemia as he just it was eating very irregularly and some days barely ate at all.  He is also been extremely inactive.  But is working on getting his strength back up.  Continue to work on [employer] and daily exercise.  Continue current regimen. Follow up in  3 months       Relevant Orders   POCT glycosylated hemoglobin (Hb A1C) (Completed)   CBC   COMPLETE METABOLIC PANEL WITH GFR   Lipid panel     Other   Opacity of lung on imaging study    See note above.  Plan on CT high resolution chest in 3 to 6 months.      BMI 35.0-35.9,adult    To work on healthy food choices and trying to get active again I do think the shortness of breath from the chronic respiratory changes are gone to be somewhat limiting but hopefully he can continue to just push himself and gain some stamina.         Meds ordered this encounter  Medications  . AMBULATORY NON FORMULARY MEDICATION    Sig: Medication Name: Please discontinue oxygen and supplies. Fax to [place]:  1 Units    Refill:  0    Follow-up: Return in about 3 months (around 09/06/2020) for Diabetes follow-up.    Beatrice Lecher, MD

## 2020-06-09 NOTE — Assessment & Plan Note (Addendum)
Is actually been able to go all week without his oxygen and has been doing well he has been keeping his pulse ox around 96%.  We will send a order to discontinue oxygen.  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

## 2020-06-09 NOTE — Assessment & Plan Note (Signed)
To work on healthy food choices and trying to get active again I do think the shortness of breath from the chronic respiratory changes are gone to be somewhat limiting but hopefully he can continue to just push himself and gain some stamina.

## 2020-06-10 DIAGNOSIS — E1165 Type 2 diabetes mellitus with hyperglycemia: Secondary | ICD-10-CM | POA: Diagnosis not present

## 2020-06-10 DIAGNOSIS — J9601 Acute respiratory failure with hypoxia: Secondary | ICD-10-CM | POA: Diagnosis not present

## 2020-06-11 ENCOUNTER — Encounter: Payer: Self-pay | Admitting: Family Medicine

## 2020-06-13 LAB — CBC
HCT: 42.1 % (ref 38.5–50.0)
Hemoglobin: 14.2 g/dL (ref 13.2–17.1)
MCH: 30.7 pg (ref 27.0–33.0)
MCHC: 33.7 g/dL (ref 32.0–36.0)
MCV: 90.9 fL (ref 80.0–100.0)
MPV: 13.7 fL — ABNORMAL HIGH (ref 7.5–12.5)
Platelets: 283 10*3/uL (ref 140–400)
RBC: 4.63 10*6/uL (ref 4.20–5.80)
RDW: 12.8 % (ref 11.0–15.0)
WBC: 7.6 10*3/uL (ref 3.8–10.8)

## 2020-06-13 LAB — COMPLETE METABOLIC PANEL WITH GFR
AG Ratio: 1.5 (calc) (ref 1.0–2.5)
ALT: 32 U/L (ref 9–46)
AST: 20 U/L (ref 10–40)
Albumin: 3.9 g/dL (ref 3.6–5.1)
Alkaline phosphatase (APISO): 93 U/L (ref 36–130)
BUN: 13 mg/dL (ref 7–25)
CO2: 24 mmol/L (ref 20–32)
Calcium: 9.7 mg/dL (ref 8.6–10.3)
Chloride: 106 mmol/L (ref 98–110)
Creat: 0.78 mg/dL (ref 0.60–1.35)
GFR, Est African American: 123 mL/min/{1.73_m2} (ref >=60)
GFR, Est Non African American: 106 mL/min/{1.73_m2} (ref >=60)
Globulin: 2.6 g/dL (calc) (ref 1.9–3.7)
Glucose, Bld: 209 mg/dL — ABNORMAL HIGH (ref 65–99)
Potassium: 4.7 mmol/L (ref 3.5–5.3)
Sodium: 138 mmol/L (ref 135–146)
Total Bilirubin: 0.4 mg/dL (ref 0.2–1.2)
Total Protein: 6.5 g/dL (ref 6.1–8.1)

## 2020-06-13 LAB — LIPID PANEL
Cholesterol: 172 mg/dL (ref <200)
HDL: 27 mg/dL — ABNORMAL LOW (ref >=40)
Non-HDL Cholesterol (Calc): 145 mg/dL (calc) — ABNORMAL HIGH (ref <130)
Total CHOL/HDL Ratio: 6.4 (calc) — ABNORMAL HIGH (ref <5.0)
Triglycerides: 454 mg/dL — ABNORMAL HIGH (ref <150)

## 2020-06-13 LAB — TEST AUTHORIZATION

## 2020-06-13 LAB — LDL CHOLESTEROL, DIRECT: Direct LDL: 83 mg/dL (ref <100)

## 2020-09-08 ENCOUNTER — Other Ambulatory Visit: Payer: Self-pay

## 2020-09-08 ENCOUNTER — Ambulatory Visit (INDEPENDENT_AMBULATORY_CARE_PROVIDER_SITE_OTHER): Payer: BC Managed Care – PPO | Admitting: Family Medicine

## 2020-09-08 ENCOUNTER — Encounter: Payer: Self-pay | Admitting: Family Medicine

## 2020-09-08 VITALS — BP 127/77 | HR 79 | Ht 71.0 in | Wt 272.0 lb

## 2020-09-08 DIAGNOSIS — E1165 Type 2 diabetes mellitus with hyperglycemia: Secondary | ICD-10-CM

## 2020-09-08 LAB — POCT GLYCOSYLATED HEMOGLOBIN (HGB A1C): Hemoglobin A1C: 8.5 % — AB (ref 4.0–5.6)

## 2020-09-08 MED ORDER — ATORVASTATIN CALCIUM 20 MG PO TABS: 20.0000 mg | ORAL_TABLET | Freq: Every day | ORAL | 3 refills | Status: DC

## 2020-09-08 MED ORDER — GLUCOSE BLOOD VI STRP: ORAL_STRIP | 12 refills | Status: DC

## 2020-09-08 MED ORDER — DAPAGLIFLOZIN PRO-METFORMIN ER 10-1000 MG PO TB24: 1.0000 | ORAL_TABLET | Freq: Every day | ORAL | 5 refills | Status: DC

## 2020-09-08 NOTE — Assessment & Plan Note (Addendum)
A1c down to 8.5 today which is great progress just encouraged him to continue to work on healthy diet and regular exercise and weight loss.  Add Farxiga to his regimen in combination with his metformin.  Did encourage him to make sure that he is staying well-hydrated can increase urination.  Send new prescription for strips.

## 2020-09-08 NOTE — Progress Notes (Signed)
Established Patient Office Visit  Subjective:  Patient ID: Samuel Cherry, male    DOB: [birth year]  Age: 50 y.o. MRN: [id number]  CC:  Chief Complaint  Patient presents with  . Diabetes    HPI Samuel Cherry presents for   Diabetes - no hypoglycemic events. No wounds or sores that are not healing well. No increased thirst or urination. Checking glucose at home. Taking medications as prescribed without any side effects.  He says has been trying to eat more vegetables and salads.  BMI 37-he has gained back about 20 pounds since he was last here though he does report he is trying to eat more healthy.  He did get his COVID-vaccine.   History reviewed. No pertinent past medical history.  History reviewed. No pertinent surgical history.  Family History  Problem Relation Age of Onset  . Cancer Father   . Thyroid disease Neg Hx     Social History   Socioeconomic History  . Marital status: Married    Spouse name: Not on file  . Number of children: Not on file  . Years of education: Not on file  . Highest education level: Not on file  Occupational History  . Not on file  Tobacco Use  . Smoking status: Former Smoker    Quit date: 04/20/2016    Years since quitting: 4.3  . Smokeless tobacco: Never Used  . Tobacco comment: 1-2 cigs QD  Substance and Sexual Activity  . Alcohol use: Not Currently    Alcohol/week: 1.0 standard drink    Types: 1 Cans of beer per week    Comment: rarely  . Drug use: No  . Sexual activity: Not on file  Other Topics Concern  . Not on file  Social History Narrative  . Not on file   Social Determinants of Health   Financial Resource Strain: Not on file  Food Insecurity: Not on file  Transportation Needs: Not on file  Physical Activity: Not on file  Stress: Not on file  Social Connections: Not on file  Intimate Partner Violence: Not on file    Outpatient Medications Prior to Visit  Medication Sig Dispense Refill   . blood glucose meter kit and supplies Dispense based on patient and insurance preference. Use up to four times daily as directed. Dx:E11.65 1 each 0  . glipiZIDE (GLUCOTROL) 5 MG tablet TAKE 1 TABLET TWICE A DAY BEFORE MEALS 180 tablet 3  . atorvastatin (LIPITOR) 20 MG tablet Take 1 tablet (20 mg total) by mouth at bedtime. 90 tablet 3  . metFORMIN (GLUCOPHAGE) 1000 MG tablet TAKE 1 TABLET TWICE A DAY WITH MEALS 180 tablet 3  . AMBULATORY NON FORMULARY MEDICATION Medication Name: Please discontinue oxygen and supplies. Fax to [place] 1 Units 0   No facility-administered medications prior to visit.    No Known Allergies  ROS Review of Systems    Objective:    Physical Exam Constitutional:      Appearance: He is well-developed.  HENT:     Head: Normocephalic and atraumatic.  Cardiovascular:     Rate and Rhythm: Normal rate and regular rhythm.     Heart sounds: Normal heart sounds.  Pulmonary:     Effort: Pulmonary effort is normal.     Breath sounds: Normal breath sounds.  Skin:    General: Skin is warm and dry.  Neurological:     Mental Status: He is alert and oriented to person, place, and time.  Psychiatric:        Behavior: Behavior normal.     BP 127/77   Pulse 79   Ht _0  (1.803 m)   Wt 272 lb (123.4 kg)   SpO2 97%   BMI 37.94 kg/m  Wt Readings from Last 3 Encounters:  09/08/20 272 lb (123.4 kg)  06/09/20 252 lb (114.3 kg)  05/20/20 254 lb (115.2 kg)     Health Maintenance Due  Topic Date Due  . COLONOSCOPY (Pts 45-57yr Insurance coverage will need to be confirmed)  Never done    There are no preventive care reminders to display for this patient.  Lab Results  Component Value Date   TSH 1.96 06/02/2018   Lab Results  Component Value Date   WBC 7.6 06/10/2020   HGB 14.2 06/10/2020   HCT 42.1 06/10/2020   MCV 90.9 06/10/2020   PLT 283 06/10/2020   Lab Results  Component Value Date   NA 138 06/10/2020   K 4.7 06/10/2020   CO2 24  06/10/2020   GLUCOSE 209 (H) 06/10/2020   BUN 13 06/10/2020   CREATININE 0.78 06/10/2020   BILITOT 0.4 06/10/2020   AST 20 06/10/2020   ALT 32 06/10/2020   PROT 6.5 06/10/2020   CALCIUM 9.7 06/10/2020   Lab Results  Component Value Date   CHOL 172 06/10/2020   Lab Results  Component Value Date   HDL 27 (L) 06/10/2020   Lab Results  Component Value Date   [facility] 06/10/[facility]     Comment:     . LDL cholesterol not calculated. Triglyceride levels greater than 400 mg/dL invalidate calculated LDL results. . Reference range: <100 . Desirable range <100 mg/dL for primary prevention;   <70 mg/dL for patients with CHD or diabetic patients  with > or = 2 CHD risk factors. .Marland KitchenLDL-C is now calculated using the Martin-Hopkins  calculation, which is a validated novel method providing  better accuracy than the Friedewald equation in the  estimation of LDL-C.  MCresenciano Genreet al. JAnnamaria Helling 27989;211(94: 2061-2068  (http://education.[facility].com/faq/FAQ164)    Lab Results  Component Value Date   TRIG 454 (H) 06/10/2020   Lab Results  Component Value Date   CHOLHDL 6.4 (H) 06/10/2020   Lab Results  Component Value Date   HGBA1C 8.5 (A) 09/08/2020      Assessment & Plan:   Problem List Items Addressed This Visit      Endocrine   Uncontrolled type 2 diabetes mellitus with hyperglycemia [place]    A1c down to 8.5 today which is great progress just encouraged him to continue to work on healthy diet and regular exercise and weight loss.  Add Farxiga to his regimen in combination with his metformin.  Did encourage him to make sure that he is staying well-hydrated can increase urination.  Send new prescription for strips.      Relevant Medications   atorvastatin (LIPITOR) 20 MG tablet   Dapagliflozin-metFORMIN HCl ER 01-999 MG TB24   glucose blood test strip   Other Relevant Orders   POCT glycosylated hemoglobin (Hb A1C) (Completed)      Meds ordered this  encounter  Medications  . DISCONTD: Dapagliflozin-metFORMIN HCl ER 01-999 MG TB24    Sig: Take 1 tablet by mouth daily. uSE COUPON CARD    Dispense:  30 tablet    Refill:  5  . atorvastatin (LIPITOR) 20 MG tablet    Sig: Take 1 tablet (20 mg total) by mouth at  bedtime.    Dispense:  90 tablet    Refill:  3  . Dapagliflozin-metFORMIN HCl ER 01-999 MG TB24    Sig: Take 1 tablet by mouth daily. uSE COUPON CARD    Dispense:  30 tablet    Refill:  5  . glucose blood test strip    Sig: Use as instructed    Dispense:  100 each    Refill:  12    strips that work with his current glucometer.  Tests twice a day.  KE:[id number] diabetes    Follow-up: Return in about 3 months (around 12/09/2020) for Diabetes follow-up.    Beatrice Lecher, MD

## 2020-09-10 ENCOUNTER — Other Ambulatory Visit: Payer: Self-pay | Admitting: Family Medicine

## 2020-09-10 DIAGNOSIS — E1165 Type 2 diabetes mellitus with hyperglycemia: Secondary | ICD-10-CM

## 2020-12-09 ENCOUNTER — Encounter: Payer: Self-pay | Admitting: Family Medicine

## 2020-12-09 ENCOUNTER — Other Ambulatory Visit: Payer: Self-pay

## 2020-12-09 ENCOUNTER — Ambulatory Visit: Payer: BC Managed Care – PPO | Admitting: Family Medicine

## 2020-12-09 VITALS — BP 135/80 | HR 70 | Temp 97.9°F | Ht 71.0 in | Wt 270.0 lb

## 2020-12-09 DIAGNOSIS — R748 Abnormal levels of other serum enzymes: Secondary | ICD-10-CM

## 2020-12-09 DIAGNOSIS — Z125 Encounter for screening for malignant neoplasm of prostate: Secondary | ICD-10-CM

## 2020-12-09 DIAGNOSIS — R5383 Other fatigue: Secondary | ICD-10-CM | POA: Diagnosis not present

## 2020-12-09 DIAGNOSIS — E1165 Type 2 diabetes mellitus with hyperglycemia: Secondary | ICD-10-CM

## 2020-12-09 DIAGNOSIS — F5101 Primary insomnia: Secondary | ICD-10-CM

## 2020-12-09 DIAGNOSIS — E538 Deficiency of other specified B group vitamins: Secondary | ICD-10-CM | POA: Diagnosis not present

## 2020-12-09 LAB — POCT GLYCOSYLATED HEMOGLOBIN (HGB A1C): Hemoglobin A1C: 7.4 % — AB (ref 4.0–5.6)

## 2020-12-09 MED ORDER — ATORVASTATIN CALCIUM 20 MG PO TABS: 20.0000 mg | ORAL_TABLET | Freq: Every day | ORAL | 3 refills | Status: DC

## 2020-12-09 NOTE — Progress Notes (Signed)
Established Patient Office Visit  Subjective:  Patient ID: Samuel Cherry, male    DOB: Oct 18, 1970  Age: 50 y.o. MRN: [id number]  CC:  Chief Complaint  Patient presents with   Diabetes    HPI [place] presents for   Diabetes - no hypoglycemic events. No wounds or sores that are not healing well. No increased thirst or urination. Checking glucose at home. Taking medications as prescribed without any side effects.  Lost 2 more pounds since I last saw him.  Never started the atorvastatin.  He says he called the mail order because he never received it they said that they shipped it but he still never got a shipment him.  Hyperlipidemia-he says that he did not receive the prescription for the atorvastatin in the mail order says that they mailed it but he never received it and called them and told them but they did not send a new shipment so he would like a new prescription sent to the local pharmacy.  He also reports still feeling fatigued he says some days he is good he has more energy and then other days he is just really tired and gets short of breath.  He has been having more difficulty falling asleep lately he says his mind will just race so often times he finds himself going to bed around 1 AM and then getting up at 8 AM.  He has been a little bit more stressed working on some projects for his son.  History reviewed. No pertinent past medical history.  History reviewed. No pertinent surgical history.  Family History  Problem Relation Age of Onset   Cancer Father    Thyroid disease Neg Hx     Social History   Socioeconomic History   Marital status: Married    Spouse name: Not on file   Number of children: Not on file   Years of education: Not on file   Highest education level: Not on file  Occupational History   Not on file  Tobacco Use   Smoking status: Former    Types: Cigarettes    Quit date: 04/20/2016    Years since quitting: 4.6    Smokeless tobacco: Never   Tobacco comments:    1-2 cigs QD  Substance and Sexual Activity   Alcohol use: Not Currently    Alcohol/week: 1.0 standard drink    Types: 1 Cans of beer per week    Comment: rarely   Drug use: No   Sexual activity: Not on file  Other Topics Concern   Not on file  Social History Narrative   Not on file   Social Determinants of Health   Financial Resource Strain: Not on file  Food Insecurity: Not on file  Transportation Needs: Not on file  Physical Activity: Not on file  Stress: Not on file  Social Connections: Not on file  Intimate Partner Violence: Not on file    Outpatient Medications Prior to Visit  Medication Sig Dispense Refill   blood glucose meter kit and supplies Dispense based on patient and insurance preference. Use up to four times daily as directed. Dx:E11.65 1 each 0   Dapagliflozin-metFORMIN HCl ER 01-999 MG TB24 Take 1 tablet by mouth daily. uSE COUPON CARD 30 tablet 5   glipiZIDE (GLUCOTROL) 5 MG tablet TAKE 1 TABLET TWICE A DAY BEFORE MEALS 180 tablet 3   glucose blood test strip Use as instructed 100 each 12   atorvastatin (LIPITOR) 20  MG tablet Take 1 tablet (20 mg total) by mouth at bedtime. (Patient not taking: Reported on 12/09/2020) 90 tablet 3   No facility-administered medications prior to visit.    No Known Allergies  ROS Review of Systems    Objective:    Physical Exam Constitutional:      Appearance: Normal appearance. He is well-developed.  HENT:     Head: Normocephalic and atraumatic.  Cardiovascular:     Rate and Rhythm: Normal rate and regular rhythm.     Heart sounds: Normal heart sounds.  Pulmonary:     Effort: Pulmonary effort is normal.     Breath sounds: Normal breath sounds.  Skin:    General: Skin is warm and dry.  Neurological:     Mental Status: He is alert and oriented to person, place, and time. Mental status is at baseline.  Psychiatric:        Behavior: Behavior normal.   BP 135/80    Pulse 70   Temp 97.9 F (36.6 C)   Ht _0  (1.803 m)   Wt 270 lb (122.5 kg)   SpO2 98%   BMI 37.66 kg/m  Wt Readings from Last 3 Encounters:  12/09/20 270 lb (122.5 kg)  09/08/20 272 lb (123.4 kg)  06/09/20 252 lb (114.3 kg)     Health Maintenance Due  Topic Date Due   COLONOSCOPY (Pts 45-62yr Insurance coverage will need to be confirmed)  Never done   OPHTHALMOLOGY EXAM  09/17/2020   INFLUENZA VACCINE  11/17/2020    There are no preventive care reminders to display for this patient.  Lab Results  Component Value Date   TSH 1.96 06/02/2018   Lab Results  Component Value Date   WBC 7.6 06/10/2020   HGB 14.2 06/10/2020   HCT 42.1 06/10/2020   MCV 90.9 06/10/2020   PLT 283 06/10/2020   Lab Results  Component Value Date   NA 138 06/10/2020   K 4.7 06/10/2020   CO2 24 06/10/2020   GLUCOSE 209 (H) 06/10/2020   BUN 13 06/10/2020   CREATININE 0.78 06/10/2020   BILITOT 0.4 06/10/2020   AST 20 06/10/2020   ALT 32 06/10/2020   PROT 6.5 06/10/2020   CALCIUM 9.7 06/10/2020   Lab Results  Component Value Date   CHOL 172 06/10/2020   Lab Results  Component Value Date   HDL 27 (L) 06/10/2020   Lab Results  Component Value Date   [facility] 06/10/2020     Comment:     . LDL cholesterol not calculated. Triglyceride levels greater than 400 mg/dL invalidate calculated LDL results. . Reference range: <100 . Desirable range <100 mg/dL for primary prevention;   <70 mg/dL for patients with CHD or diabetic patients  with > or = 2 CHD risk factors. .Marland KitchenLDL-C is now calculated using the Martin-Hopkins  calculation, which is a validated novel method providing  better accuracy than the Friedewald equation in the  estimation of LDL-C.  MCresenciano Genreet al. JAnnamaria Helling 22836;629(47: 2061-2068  (http://education.[facility].com/faq/FAQ164)    Lab Results  Component Value Date   TRIG 454 (H) 06/10/2020   Lab Results  Component Value Date   CHOLHDL 6.4 (H) 06/10/2020    Lab Results  Component Value Date   HGBA1C 7.4 (A) 12/09/2020      Assessment & Plan:   Problem List Items Addressed This Visit       Endocrine   Uncontrolled type 2 diabetes mellitus with hyperglycemia [place]  He is really been making great progress.  A1c is down to 7.4 from 8.5.  He says he is been doing better with trying to eat more salads and vegetables.  He is also quit eating in the evenings after dinner.  He still eating a little bit of sweets here and there but he thinks that will get better next month when his son goes away to school.      Relevant Medications   atorvastatin (LIPITOR) 20 MG tablet   Other Relevant Orders   POCT glycosylated hemoglobin (Hb A1C) (Completed)   COMPLETE METABOLIC PANEL WITH GFR   TSH   CBC   Urinalysis, Routine w reflex microscopic   Fe+TIBC+Fer   B12     Other   Elevated liver enzymes   Relevant Orders   COMPLETE METABOLIC PANEL WITH GFR   TSH   CBC   Urinalysis, Routine w reflex microscopic   Fe+TIBC+Fer   B12   Other Visit Diagnoses     Fatigue, unspecified type       Relevant Orders   COMPLETE METABOLIC PANEL WITH GFR   TSH   CBC   Urinalysis, Routine w reflex microscopic   Fe+TIBC+Fer   B12   Screening for malignant neoplasm of prostate       Relevant Orders   PSA   Primary insomnia          Insomnia-it does sound like there might be some slight increase stress that could be making it more difficult making his mind race.  Just encouraged him to continue to work on sleep [place].  He reports he is still well rested when he gets up so he does not feel like it is impacting him during the day which is great.  Though still could be contributing to some of his intermittent fatigue has been experiencing  Fatigue-we will check for electrolyte abnormalities, thyroid disorder and deficiencies.  He has been on metformin for several years so we will check for B12 deficiency.  History of elevated liver enzymes are  due to make sure that those are not up from his baseline.  Meds ordered this encounter  Medications   atorvastatin (LIPITOR) 20 MG tablet    Sig: Take 1 tablet (20 mg total) by mouth at bedtime.    Dispense:  90 tablet    Refill:  3    Follow-up: Return in about 3 months (around 03/11/2021) for Diabetes follow-up.    Beatrice Lecher, MD

## 2020-12-09 NOTE — Assessment & Plan Note (Signed)
He is really been making great progress.  A1c is down to 7.4 from 8.5.  He says he is been doing better with trying to eat more salads and vegetables.  He is also quit eating in the evenings after dinner.  He still eating a little bit of sweets here and there but he thinks that will get better next month when his son goes away to school.

## 2020-12-10 LAB — URINALYSIS, ROUTINE W REFLEX MICROSCOPIC
Bilirubin Urine: NEGATIVE
Glucose, UA: NEGATIVE
Hgb urine dipstick: NEGATIVE
Ketones, ur: NEGATIVE
Leukocytes,Ua: NEGATIVE
Nitrite: NEGATIVE
Protein, ur: NEGATIVE
Specific Gravity, Urine: 1.02 (ref 1.001–1.035)
pH: <=5 (ref 5.0–8.0)

## 2020-12-10 LAB — CBC
HCT: 45.1 % (ref 38.5–50.0)
Hemoglobin: 14.9 g/dL (ref 13.2–17.1)
MCH: 29.8 pg (ref 27.0–33.0)
MCHC: 33 g/dL (ref 32.0–36.0)
MCV: 90.2 fL (ref 80.0–100.0)
MPV: 12.3 fL (ref 7.5–12.5)
Platelets: 300 10*3/uL (ref 140–400)
RBC: 5 10*6/uL (ref 4.20–5.80)
RDW: 12.5 % (ref 11.0–15.0)
WBC: 7.7 10*3/uL (ref 3.8–10.8)

## 2020-12-10 LAB — TSH: TSH: 3.42 mIU/L (ref 0.40–4.50)

## 2020-12-10 LAB — COMPLETE METABOLIC PANEL WITH GFR
AG Ratio: 1.4 (calc) (ref 1.0–2.5)
ALT: 21 U/L (ref 9–46)
AST: 16 U/L (ref 10–35)
Albumin: 4 g/dL (ref 3.6–5.1)
Alkaline phosphatase (APISO): 81 U/L (ref 35–144)
BUN: 9 mg/dL (ref 7–25)
CO2: 26 mmol/L (ref 20–32)
Calcium: 9.4 mg/dL (ref 8.6–10.3)
Chloride: 106 mmol/L (ref 98–110)
Creat: 0.87 mg/dL (ref 0.70–1.30)
Globulin: 2.8 g/dL (calc) (ref 1.9–3.7)
Glucose, Bld: 176 mg/dL — ABNORMAL HIGH (ref 65–99)
Potassium: 5.1 mmol/L (ref 3.5–5.3)
Sodium: 138 mmol/L (ref 135–146)
Total Bilirubin: 0.3 mg/dL (ref 0.2–1.2)
Total Protein: 6.8 g/dL (ref 6.1–8.1)
eGFR: 105 mL/min/{1.73_m2} (ref >=60)

## 2020-12-10 LAB — IRON,TIBC AND FERRITIN PANEL
%SAT: 17 % (calc) — ABNORMAL LOW (ref 20–48)
Ferritin: 185 ng/mL (ref 38–380)
Iron: 55 ug/dL (ref 50–180)
TIBC: 330 mcg/dL (calc) (ref 250–425)

## 2020-12-10 LAB — PSA: PSA: 0.23 ng/mL (ref <=4.00)

## 2020-12-10 LAB — VITAMIN B12: Vitamin B-12: 273 pg/mL (ref 200–1100)

## 2020-12-10 NOTE — Progress Notes (Signed)
Hi Samuel Cherry, your metabolic panel, thyroid, and blood count all look normal.  The urinalysis is clear as well.  Your iron is a little bit on the low end.  So would recommend either a multivitamin with iron, eating extra iron in your diet or could consider an iron supplement.  And then we can recheck your iron levels in about 8 weeks.  Having low iron is a little bit unusual in men so we usually recommend screening for colon cancer if you have low iron.  I would like to get you referred for a screening colonoscopy.  Vitamin B12 is also on the low end of normal.  Again would recommend a multivitamin that has B12 in it or you can even take a over-the-counter separate vitamin B12.  We can recheck that in about 8 weeks as well.  Your prostate test is normal.

## 2021-02-18 DIAGNOSIS — E119 Type 2 diabetes mellitus without complications: Secondary | ICD-10-CM | POA: Diagnosis not present

## 2021-02-18 LAB — HM DIABETES EYE EXAM

## 2021-02-27 ENCOUNTER — Encounter: Payer: Self-pay | Admitting: Family Medicine

## 2021-03-11 ENCOUNTER — Telehealth: Payer: Self-pay

## 2021-03-11 ENCOUNTER — Other Ambulatory Visit: Payer: Self-pay

## 2021-03-11 ENCOUNTER — Ambulatory Visit (INDEPENDENT_AMBULATORY_CARE_PROVIDER_SITE_OTHER): Payer: BC Managed Care – PPO | Admitting: Family Medicine

## 2021-03-11 ENCOUNTER — Telehealth: Payer: Self-pay | Admitting: Family Medicine

## 2021-03-11 ENCOUNTER — Encounter: Payer: Self-pay | Admitting: Family Medicine

## 2021-03-11 VITALS — BP 126/78 | HR 87 | Ht 71.0 in | Wt 273.0 lb

## 2021-03-11 DIAGNOSIS — Z23 Encounter for immunization: Secondary | ICD-10-CM

## 2021-03-11 DIAGNOSIS — Z1211 Encounter for screening for malignant neoplasm of colon: Secondary | ICD-10-CM

## 2021-03-11 DIAGNOSIS — E611 Iron deficiency: Secondary | ICD-10-CM

## 2021-03-11 DIAGNOSIS — E1165 Type 2 diabetes mellitus with hyperglycemia: Secondary | ICD-10-CM | POA: Diagnosis not present

## 2021-03-11 LAB — POCT GLYCOSYLATED HEMOGLOBIN (HGB A1C): Hemoglobin A1C: 8.3 % — AB (ref 4.0–5.6)

## 2021-03-11 MED ORDER — DAPAGLIFLOZIN PRO-METFORMIN ER 10-1000 MG PO TB24: 1.0000 | ORAL_TABLET | Freq: Every day | ORAL | 1 refills | Status: DC

## 2021-03-11 MED ORDER — TRULICITY 0.75 MG/0.5ML ~~LOC~~ SOAJ: 0.7500 mg | SUBCUTANEOUS | 2 refills | Status: DC

## 2021-03-11 NOTE — Telephone Encounter (Signed)
Please call patient.  I meant to reorder his iron today when he came in for his visit I apologize for forgetting to do that I would know we had discussed it.  I did place an order and he can come anytime at his convenience.  Also get a send over a medication called Trulicity for his diabetes in addition to the [place].  It is a once a week injection that is done in the abdomen.  It can help lower A1c by about a point half and can also help curb appetite.  Add like for [place] to give it a try between now and when I see him back in addition to the Xigduo pill.

## 2021-03-11 NOTE — Progress Notes (Signed)
Established Patient Office Visit  Subjective:  Patient ID: Samuel Cherry, male    DOB: 02-12-1971  Age: 50 y.o. MRN: [id number]  CC:  Chief Complaint  Patient presents with   Diabetes    HPI Samuel Cherry presents for   Diabetes - no hypoglycemic events. No wounds or sores that are not healing well. No increased thirst or urination. Checking glucose at home. Taking medications as prescribed without any side effects.  He ran out of his Samuel Cherry about a month ago but has been taking the glipizide.   He had labs in August and was found to have iron that was on the low end of normal.  He has been taking an iron supplement.  No past medical history on file.  No past surgical history on file.  Family History  Problem Relation Age of Onset   Cancer Father    Thyroid disease Neg Hx     Social History   Socioeconomic History   Marital status: Married    Spouse name: Not on file   Number of children: Not on file   Years of education: Not on file   Highest education level: Not on file  Occupational History   Not on file  Tobacco Use   Smoking status: Former    Types: Cigarettes    Quit date: 04/20/2016    Years since quitting: 4.8   Smokeless tobacco: Never   Tobacco comments:    1-2 cigs QD  Substance and Sexual Activity   Alcohol use: Not Currently    Alcohol/week: 1.0 standard drink    Types: 1 Cans of beer per week    Comment: rarely   Drug use: No   Sexual activity: Not on file  Other Topics Concern   Not on file  Social History Narrative   Not on file   Social Determinants of Health   Financial Resource Strain: Not on file  Food Insecurity: Not on file  Transportation Needs: Not on file  Physical Activity: Not on file  Stress: Not on file  Social Connections: Not on file  Intimate Partner Violence: Not on file    Outpatient Medications Prior to Visit  Medication Sig Dispense Refill   atorvastatin (LIPITOR) 20 MG tablet Take 1  tablet (20 mg total) by mouth at bedtime. 90 tablet 3   blood glucose meter kit and supplies Dispense based on patient and insurance preference. Use up to four times daily as directed. Dx:E11.65 1 each 0   glipiZIDE (GLUCOTROL) 5 MG tablet TAKE 1 TABLET TWICE A DAY BEFORE MEALS 180 tablet 3   glucose blood test strip Use as instructed 100 each 12   Dapagliflozin-metFORMIN HCl ER 01-999 MG TB24 Take 1 tablet by mouth daily. uSE COUPON CARD 30 tablet 5   No facility-administered medications prior to visit.    No Known Allergies  ROS Review of Systems    Objective:    Physical Exam Constitutional:      Appearance: Normal appearance. He is well-developed.  HENT:     Head: Normocephalic and atraumatic.  Cardiovascular:     Rate and Rhythm: Normal rate and regular rhythm.     Heart sounds: Normal heart sounds.  Pulmonary:     Effort: Pulmonary effort is normal.     Breath sounds: Normal breath sounds.  Skin:    General: Skin is warm and dry.  Neurological:     Mental Status: He is alert and oriented to person,  place, and time. Mental status is at baseline.  Psychiatric:        Behavior: Behavior normal.    BP 126/78   Pulse 87   Ht 5' 11" (1.803 m)   Wt 273 lb (123.8 kg)   SpO2 97%   BMI 38.08 kg/m  Wt Readings from Last 3 Encounters:  03/11/21 273 lb (123.8 kg)  12/09/20 270 lb (122.5 kg)  09/08/20 272 lb (123.4 kg)     Health Maintenance Due  Topic Date Due   HIV Screening  Never done   COLONOSCOPY (Pts 45-53yr Insurance coverage will need to be confirmed)  Never done   COVID-19 Vaccine ([place] - [place] 11/12/2020   INFLUENZA VACCINE  Never done    There are no preventive care reminders to display for this patient.  Lab Results  Component Value Date   TSH 3.42 12/09/2020   Lab Results  Component Value Date   WBC 7.7 12/09/2020   HGB 14.9 12/09/2020   HCT 45.1 12/09/2020   MCV 90.2 12/09/2020   PLT 300 12/09/2020   Lab Results   Component Value Date   NA 138 12/09/2020   K 5.1 12/09/2020   CO2 26 12/09/2020   GLUCOSE 176 (H) 12/09/2020   BUN 9 12/09/2020   CREATININE 0.87 12/09/2020   BILITOT 0.3 12/09/2020   AST 16 12/09/2020   ALT 21 12/09/2020   PROT 6.8 12/09/2020   CALCIUM 9.4 12/09/2020   EGFR 105 12/09/2020   Lab Results  Component Value Date   CHOL 172 06/10/2020   Lab Results  Component Value Date   HDL 27 (L) 06/10/2020   Lab Results  Component Value Date   [facility]     Comment:     . LDL cholesterol not calculated. Triglyceride levels greater than 400 mg/dL invalidate calculated LDL results. . Reference range: <100 . Desirable range <100 mg/dL for primary prevention;   <70 mg/dL for patients with CHD or diabetic patients  with > or = 2 CHD risk factors. .Marland KitchenLDL-C is now calculated using the Samuel Cherry  calculation, which is a validated novel method providing  better accuracy than the Friedewald equation in the  estimation of LDL-C.  MCresenciano Genreet al. JAnnamaria Helling 24193;790(24: 2061-2068  (http://education.[facility].com/faq/FAQ164)    Lab Results  Component Value Date   TRIG 454 (H) 06/10/2020   Lab Results  Component Value Date   CHOLHDL 6.4 (H) 06/10/2020   Lab Results  Component Value Date   HGBA1C 8.3 (A) 03/11/2021      Assessment & Plan:   Problem List Items Addressed This Visit       Endocrine   Uncontrolled type 2 diabetes mellitus with hyperglycemia [place]    A1c jumped up significantly today.  We will see if we cannot get the Xigduo refilled.  Provided coupon card.  If not covered then please let uKoreaknow and we can switch to something different.  Is really encouraged her to work on cutting back on carbs and sweets and then follow-up in 3 months to see if we can get the A1c back down.  Continue with glipizide he should set up to have refills on that.  I would also like to add Trulicity.  We will see if it will be covered by the  insurance.      Relevant Medications   Dapagliflozin-metFORMIN HCl ER 01-999 MG TB24   Dulaglutide (TRULICITY) 00.97MDZ/3.2DJSOPN   Other Relevant  Orders   POCT glycosylated hemoglobin (Hb A1C) (Completed)   Other Visit Diagnoses     Screening for colon cancer       Relevant Orders   Cologuard   Need for pneumococcal vaccination       Relevant Orders   Pneumococcal conjugate vaccine 13-valent IM (Completed)   Iron deficiency       Relevant Orders   Fe+TIBC+Fer       Pneumonia vaccine updated today.  Also discussed colon cancer screening guidelines.  He would like to do Cologuard.  Lab ordered.  Meds ordered this encounter  Medications   Dapagliflozin-metFORMIN HCl ER 01-999 MG TB24    Sig: Take 1 tablet by mouth daily. uSE COUPON CARD    Dispense:  90 tablet    Refill:  1   Dulaglutide (TRULICITY) 7.34 LP/3.7TK SOPN    Sig: Inject 0.75 mg into the skin once a week.    Dispense:  2 mL    Refill:  2    Follow-up: Return in about 3 months (around 06/11/2021) for Diabetes follow-up.    Beatrice Lecher, MD

## 2021-03-11 NOTE — Progress Notes (Signed)
Established Patient Office Visit  Subjective:  Patient ID: Samuel Cherry, male    DOB: 20-Nov-1970  Age: 50 y.o. MRN: [id number]  CC:  Chief Complaint  Patient presents with   Diabetes    HPI [place] presents for   Diabetes - no hypoglycemic events. No wounds or sores that are not healing well. No increased thirst or urination. Checking glucose at home. Taking medications as prescribed without any side effects.   No past medical history on file.  No past surgical history on file.  Family History  Problem Relation Age of Onset   Cancer Father    Thyroid disease Neg Hx     Social History   Socioeconomic History   Marital status: Married    Spouse name: Not on file   Number of children: Not on file   Years of education: Not on file   Highest education level: Not on file  Occupational History   Not on file  Tobacco Use   Smoking status: Former    Types: Cigarettes    Quit date: 04/20/2016    Years since quitting: 4.8   Smokeless tobacco: Never   Tobacco comments:    1-2 cigs QD  Substance and Sexual Activity   Alcohol use: Not Currently    Alcohol/week: 1.0 standard drink    Types: 1 Cans of beer per week    Comment: rarely   Drug use: No   Sexual activity: Not on file  Other Topics Concern   Not on file  Social History Narrative   Not on file   Social Determinants of Health   Financial Resource Strain: Not on file  Food Insecurity: Not on file  Transportation Needs: Not on file  Physical Activity: Not on file  Stress: Not on file  Social Connections: Not on file  Intimate Partner Violence: Not on file    Outpatient Medications Prior to Visit  Medication Sig Dispense Refill   atorvastatin (LIPITOR) 20 MG tablet Take 1 tablet (20 mg total) by mouth at bedtime. 90 tablet 3   blood glucose meter kit and supplies Dispense based on patient and insurance preference. Use up to four times daily as directed. Dx:E11.65 1 each 0    Dapagliflozin-metFORMIN HCl ER 01-999 MG TB24 Take 1 tablet by mouth daily. uSE COUPON CARD 30 tablet 5   glipiZIDE (GLUCOTROL) 5 MG tablet TAKE 1 TABLET TWICE A DAY BEFORE MEALS 180 tablet 3   glucose blood test strip Use as instructed 100 each 12   No facility-administered medications prior to visit.    No Known Allergies  ROS Review of Systems    Objective:    Physical Exam  BP 126/78   Pulse 87   Ht _0  (1.803 m)   Wt 273 lb (123.8 kg)   SpO2 97%   BMI 38.08 kg/m  Wt Readings from Last 3 Encounters:  03/11/21 273 lb (123.8 kg)  12/09/20 270 lb (122.5 kg)  09/08/20 272 lb (123.4 kg)     Health Maintenance Due  Topic Date Due   HIV Screening  Never done   COLONOSCOPY (Pts 45-11yr Insurance coverage will need to be confirmed)  Never done   Pneumococcal Vaccine 12268Years old (2 - PCV) 10/12/2019   COVID-19 Vaccine (3 - Booster for Pf[employer] series) 11/12/2020   INFLUENZA VACCINE  Never done    There are no preventive care reminders to display for this patient.  Lab Results  Component  Value Date   TSH 3.42 12/09/2020   Lab Results  Component Value Date   WBC 7.7 12/09/2020   HGB 14.9 12/09/2020   HCT 45.1 12/09/2020   MCV 90.2 12/09/2020   PLT 300 12/09/2020   Lab Results  Component Value Date   NA 138 12/09/2020   K 5.1 12/09/2020   CO2 26 12/09/2020   GLUCOSE 176 (H) 12/09/2020   BUN 9 12/09/2020   CREATININE 0.87 12/09/2020   BILITOT 0.3 12/09/2020   AST 16 12/09/2020   ALT 21 12/09/2020   PROT 6.8 12/09/2020   CALCIUM 9.4 12/09/2020   EGFR 105 12/09/2020   Lab Results  Component Value Date   CHOL 172 06/10/2020   Lab Results  Component Value Date   HDL 27 (L) 06/10/2020   Lab Results  Component Value Date   [facility]  06/10/2020     Comment:     . LDL cholesterol not calculated. Triglyceride levels greater than 400 mg/dL invalidate calculated LDL results. . Reference range: <100 . Desirable range <100 mg/dL for primary  prevention;   <70 mg/dL for patients with CHD or diabetic patients  with > or = 2 CHD risk factors. Marland Kitchen LDL-C is now calculated using the Martin-Hopkins  calculation, which is a validated novel method providing  better accuracy than the Friedewald equation in the  estimation of LDL-C.  Cresenciano Genre et al. Annamaria Helling. 2174;715(95): 2061-2068  (http://education.[facility].com/faq/FAQ164)    Lab Results  Component Value Date   TRIG 454 (H) 06/10/2020   Lab Results  Component Value Date   CHOLHDL 6.4 (H) 06/10/2020   Lab Results  Component Value Date   HGBA1C 7.4 (A) 12/09/2020      Assessment & Plan:   Problem List Items Addressed This Visit       Endocrine   Uncontrolled type 2 diabetes mellitus with hyperglycemia ([place]) - Primary   Relevant Orders   POCT glycosylated hemoglobin (Hb A1C)   Other Visit Diagnoses     Screening for colon cancer       Relevant Orders   Cologuard       No orders of the defined types were placed in this encounter.   Follow-up: No follow-ups on file.    Beatrice Lecher, MD

## 2021-03-11 NOTE — Assessment & Plan Note (Addendum)
A1c jumped up significantly today.  We will see if we cannot get the Xigduo refilled.  Provided coupon card.  If not covered then please let us know and we can switch to something different.  Is really encouraged her to work on cutting back on carbs and sweets and then follow-up in 3 months to see if we can get the A1c back down.  Continue with glipizide he should set up to have refills on that.  I would also like to add Trulicity.  We will see if it will be covered by the insurance.

## 2021-03-11 NOTE — Telephone Encounter (Signed)
Medication: Dulaglutide (TRULICITY) 0.75 MG/0.5ML SOPN Prior authorization determination received Medication has been approved Approval dates: 02/09/2021-03/11/2022  Patient aware via: MyChart Pharmacy aware: Yes Provider aware via this encounter

## 2021-03-11 NOTE — Telephone Encounter (Signed)
Medication: Dulaglutide (TRULICITY) 0.75 MG/0.5ML SOPN Prior authorization submitted via CoverMyMeds on 03/11/2021 PA submission pending

## 2021-03-18 NOTE — Telephone Encounter (Signed)
LVM advising pt of recommendations and asked that he call back if he has questions.

## 2021-03-25 DIAGNOSIS — Z1211 Encounter for screening for malignant neoplasm of colon: Secondary | ICD-10-CM | POA: Diagnosis not present

## 2021-03-31 ENCOUNTER — Telehealth: Payer: Self-pay

## 2021-03-31 ENCOUNTER — Encounter: Payer: Self-pay | Admitting: Family Medicine

## 2021-03-31 LAB — COLOGUARD: COLOGUARD: NEGATIVE

## 2021-03-31 NOTE — Telephone Encounter (Signed)
Medication: Dapagliflozin-metFORMIN HCl ER 01-999 MG TB24 Prior authorization determination received Medication has been approved Approval dates: 03/01/2021-03/31/2022  Patient aware via: MyChart Pharmacy aware: Yes Provider aware via this encounter

## 2021-03-31 NOTE — Telephone Encounter (Signed)
Medication: Dapagliflozin-metFORMIN HCl ER 01-999 MG TB24 Prior authorization submitted via CoverMyMeds on 03/31/2021 [place] submission pending

## 2021-04-01 NOTE — Progress Notes (Signed)
Great news! Your Cologuard test is negative.  Recommend repeat colon cancer screening in 3 years.

## 2021-05-25 ENCOUNTER — Encounter: Payer: Self-pay | Admitting: Family Medicine

## 2021-05-25 DIAGNOSIS — E1165 Type 2 diabetes mellitus with hyperglycemia: Secondary | ICD-10-CM

## 2021-05-25 MED ORDER — GLIPIZIDE 5 MG PO TABS: ORAL_TABLET | ORAL | 3 refills | Status: DC

## 2021-05-26 ENCOUNTER — Other Ambulatory Visit: Payer: Self-pay

## 2021-05-26 DIAGNOSIS — E1165 Type 2 diabetes mellitus with hyperglycemia: Secondary | ICD-10-CM

## 2021-05-26 MED ORDER — DAPAGLIFLOZIN PRO-METFORMIN ER 10-1000 MG PO TB24: 1.0000 | ORAL_TABLET | Freq: Every day | ORAL | 1 refills | Status: DC

## 2021-06-10 NOTE — Progress Notes (Signed)
° °Established Patient Office Visit ° °Subjective:  °Patient ID: Samuel Cherry, male    DOB: 10/29/1970  Age: 51 y.o. MRN: [id number] ° °CC:  °Chief Complaint  °Patient presents with  ° Diabetes  °  Follow up. Patient never started Trulicity due to insurance change. Patient taking Glipizide once a day only.   ° Chest Concern  °  Patient complains of pinching sensation in chest off and on for 3 weeks   ° ° °HPI °Samuel Cherry presents for  ° °Diabetes - no hypoglycemic events. No wounds or sores that are not healing well. No increased thirst or urination. Checking glucose at home.  Reports sugars have been running in the 140s to 150s.  He never picked up the Trulicity.  Initially they did not have it in stock and then when he went to try to pick it up again he had switched insurances and they did not have his updated card. ° °He reports he is also been having a pinching sensation in his chest on and off for about 3 weeks.  He mostly notices it with position change.  Though he has had a little bit more belching than usual.  No problems swallowing.  No sore throat.  No shortness of breath or cough.  He says it just last for about 2 to 3 seconds and then eases off. ° °History reviewed. No pertinent past medical history. ° °History reviewed. No pertinent surgical history. ° °Family History  °Problem Relation Age of Onset  ° Cancer Father   ° Thyroid disease Neg Hx   ° ° °Social History  ° °Socioeconomic History  ° Marital status: Married  °  Spouse name: Not on file  ° Number of children: Not on file  ° Years of education: Not on file  ° Highest education level: Not on file  °Occupational History  ° Not on file  °Tobacco Use  ° Smoking status: Former  °  Types: Cigarettes  °  Quit date: 04/20/2016  °  Years since quitting: 5.1  ° Smokeless tobacco: Never  °Substance and Sexual Activity  ° Alcohol use: Not Currently  °  Alcohol/week: 1.0 standard drink  °  Types: 1 Cans of beer per week  °  Comment:  rarely  ° Drug use: No  ° Sexual activity: Not on file  °Other Topics Concern  ° Not on file  °Social History Narrative  ° Not on file  ° °Social Determinants of Health  ° °Financial Resource Strain: Not on file  °Food Insecurity: Not on file  °Transportation Needs: Not on file  °Physical Activity: Not on file  °Stress: Not on file  °Social Connections: Not on file  °Intimate Partner Violence: Not on file  ° ° °Outpatient Medications Prior to Visit  °Medication Sig Dispense Refill  ° atorvastatin (LIPITOR) 20 MG tablet Take 1 tablet (20 mg total) by mouth at bedtime. 90 tablet 3  ° blood glucose meter kit and supplies Dispense based on patient and insurance preference. Use up to four times daily as directed. Dx:E11.65 1 each 0  ° Dapagliflozin-metFORMIN HCl ER 01-999 MG TB24 Take 1 tablet by mouth daily. uSE COUPON CARD 90 tablet 1  ° glipiZIDE (GLUCOTROL) 5 MG tablet TAKE 1 TABLET TWICE A DAY BEFORE MEALS (Patient taking differently: TAKE 1 TABLET once a day) 180 tablet 3  ° glucose blood test strip Use as instructed 100 each 12  ° Dulaglutide (TRULICITY) 0.75 MG/0.5ML SOPN Inject   MG/0.5ML SOPN Inject 0.75 mg into the skin once a week. (Patient not taking: Reported on 06/11/2021) 2 mL 2   No facility-administered medications prior to visit.    No Known Allergies  ROS Review of Systems    Objective:    Physical Exam Constitutional:      Appearance: Normal appearance. He is well-developed.  HENT:     Head: Normocephalic and atraumatic.  Cardiovascular:     Rate and Rhythm: Normal rate and regular rhythm.     Heart sounds: Normal heart sounds.  Pulmonary:     Effort: Pulmonary effort is normal.     Breath sounds: Normal breath sounds.  Skin:    General: Skin is warm and dry.  Neurological:     Mental Status: He is alert and oriented to person, place, and time. Mental status is at baseline.  Psychiatric:        Behavior: Behavior normal.    BP 123/76    Pulse 81    Resp 18    Ht 5' 11" (1.803 m)    Wt 264 lb  (119.7 kg)    SpO2 96%    BMI 36.82 kg/m  Wt Readings from Last 3 Encounters:  06/11/21 264 lb (119.7 kg)  03/11/21 273 lb (123.8 kg)  12/09/20 270 lb (122.5 kg)     Health Maintenance Due  Topic Date Due   HIV Screening  Never done   URINE MICROALBUMIN  05/20/2021    There are no preventive care reminders to display for this patient.  Lab Results  Component Value Date   TSH 3.42 12/09/2020   Lab Results  Component Value Date   WBC 7.7 12/09/2020   HGB 14.9 12/09/2020   HCT 45.1 12/09/2020   MCV 90.2 12/09/2020   PLT 300 12/09/2020   Lab Results  Component Value Date   NA 138 12/09/2020   K 5.1 12/09/2020   CO2 26 12/09/2020   GLUCOSE 176 (H) 12/09/2020   BUN 9 12/09/2020   CREATININE 0.87 12/09/2020   BILITOT 0.3 12/09/2020   AST 16 12/09/2020   ALT 21 12/09/2020   PROT 6.8 12/09/2020   CALCIUM 9.4 12/09/2020   EGFR 105 12/09/2020   Lab Results  Component Value Date   CHOL 172 06/10/2020   Lab Results  Component Value Date   HDL 27 (L) 06/10/2020   Lab Results  Component Value Date   [facility]  06/10/2020     Comment:     . LDL cholesterol not calculated. Triglyceride levels greater than 400 mg/dL invalidate calculated LDL results. . Reference range: <100 . Desirable range <100 mg/dL for primary prevention;   <70 mg/dL for patients with CHD or diabetic patients  with > or = 2 CHD risk factors. Marland Kitchen LDL-C is now calculated using the Martin-Hopkins  calculation, which is a validated novel method providing  better accuracy than the Friedewald equation in the  estimation of LDL-C.  Cresenciano Genre et al. Annamaria Helling. 7628;315(17): 2061-2068  (http://education.[facility].com/faq/FAQ164)    Lab Results  Component Value Date   TRIG 454 (H) 06/10/2020   Lab Results  Component Value Date   CHOLHDL 6.4 (H) 06/10/2020   Lab Results  Component Value Date   HGBA1C 8.3 (A) 03/11/2021      Assessment & Plan:   Problem List Items Addressed This Visit        Endocrine   Uncontrolled type 2 diabetes mellitus with hyperglycemia ([place]) - Primary   Relevant Medications  strip  ° Dulaglutide (TRULICITY) 0.75 MG/0.5ML SOPN  ° Other Relevant Orders  ° Fe+TIBC+Fer  ° Lipid Panel w/reflex Direct LDL  ° BASIC METABOLIC PANEL WITH GFR  ° Urine Microalbumin w/creat. ratio  ° HgB A1c  °  ° Other  ° Umbilical hernia without obstruction and without gangrene  °  Still having some intermittent symptoms.  He says when he does things like wash his car after about 30 minutes he will feel the hernia start throbbing and then he actually has to push it in and he can feel when it slips back in.  Will refer him to general surgery for consultation for surgical repair. °  °  ° Relevant Orders  ° Ambulatory referral to General Surgery  ° °Other Visit Diagnoses   ° ° Iron deficiency      ° Relevant Orders  ° Fe+TIBC+Fer  ° Lipid Panel w/reflex Direct LDL  ° BASIC METABOLIC PANEL WITH GFR  ° Atypical chest pain      ° °  ° ° °Deficiency Iron -due to recheck levels. ° °Atypical chest pain-unclear etiology.  He is nontender on chest wall exam to suggest costochondritis which is certainly a possibility.  He has been having a little bit more GERD than usual.  We will go ahead and get an EKG today. ° °EKG today shows rate of 81 bpm, normal sinus rhythm with no acute ST-T wave changes. ° °Meds ordered this encounter  °Medications  ° glucose blood test strip  °  Sig: Use as instructed. Strips that work with his current glucometer.  Test once a day.  Dx:Uncontrolled diabetes  °  Dispense:  100 each  °  Refill:  12  °  s  ° Dulaglutide (TRULICITY) 0.75 MG/0.5ML SOPN  °  Sig: Inject 0.75 mg into the skin once a week.  °  Dispense:  2 mL  °  Refill:  2  ° ° °Follow-up: Return in about 3 months (around 09/14/2021) for Diabetes follow-up.  ° ° °Catherine Metheney, MD °

## 2021-06-11 ENCOUNTER — Ambulatory Visit: Payer: BC Managed Care – PPO | Admitting: Family Medicine

## 2021-06-11 ENCOUNTER — Other Ambulatory Visit: Payer: Self-pay

## 2021-06-11 ENCOUNTER — Encounter: Payer: Self-pay | Admitting: Family Medicine

## 2021-06-11 VITALS — BP 123/76 | HR 81 | Resp 18 | Ht 71.0 in | Wt 264.0 lb

## 2021-06-11 DIAGNOSIS — E611 Iron deficiency: Secondary | ICD-10-CM

## 2021-06-11 DIAGNOSIS — E1165 Type 2 diabetes mellitus with hyperglycemia: Secondary | ICD-10-CM | POA: Diagnosis not present

## 2021-06-11 DIAGNOSIS — K429 Umbilical hernia without obstruction or gangrene: Secondary | ICD-10-CM

## 2021-06-11 DIAGNOSIS — R0789 Other chest pain: Secondary | ICD-10-CM

## 2021-06-11 MED ORDER — GLUCOSE BLOOD VI STRP: ORAL_STRIP | 12 refills | Status: DC

## 2021-06-11 MED ORDER — TRULICITY 0.75 MG/0.5ML ~~LOC~~ SOAJ: 0.7500 mg | SUBCUTANEOUS | 2 refills | Status: DC

## 2021-06-11 NOTE — Assessment & Plan Note (Signed)
Still having some intermittent symptoms.  He says when he does things like wash his car after about 30 minutes he will feel the hernia start throbbing and then he actually has to push it in and he can feel when it slips back in.  Will refer him to general surgery for consultation for surgical repair.

## 2021-06-11 NOTE — Addendum Note (Signed)
Addended by: Chalmers Cater on: 06/11/2021 03:57 PM   Modules accepted: Orders

## 2021-06-12 LAB — BASIC METABOLIC PANEL WITH GFR
BUN: 12 mg/dL (ref 7–25)
CO2: 24 mmol/L (ref 20–32)
Calcium: 9.4 mg/dL (ref 8.6–10.3)
Chloride: 110 mmol/L (ref 98–110)
Creat: 0.86 mg/dL (ref 0.70–1.30)
Glucose, Bld: 164 mg/dL — ABNORMAL HIGH (ref 65–99)
Potassium: 4.8 mmol/L (ref 3.5–5.3)
Sodium: 141 mmol/L (ref 135–146)
eGFR: 105 mL/min/{1.73_m2} (ref >=60)

## 2021-06-12 LAB — MICROALBUMIN / CREATININE URINE RATIO
Creatinine, Urine: 91 mg/dL (ref 20–320)
Microalb Creat Ratio: 3 mcg/mg creat (ref <30)
Microalb, Ur: 0.3 mg/dL

## 2021-06-12 LAB — LIPID PANEL W/REFLEX DIRECT LDL
Cholesterol: 163 mg/dL (ref <200)
HDL: 35 mg/dL — ABNORMAL LOW (ref >=40)
LDL Cholesterol (Calc): 101 mg/dL (calc) — ABNORMAL HIGH
Non-HDL Cholesterol (Calc): 128 mg/dL (calc) (ref <130)
Total CHOL/HDL Ratio: 4.7 (calc) (ref <5.0)
Triglycerides: 173 mg/dL — ABNORMAL HIGH (ref <150)

## 2021-06-12 LAB — HEMOGLOBIN A1C
Hgb A1c MFr Bld: 7.7 % of total Hgb — ABNORMAL HIGH (ref <5.7)
Mean Plasma Glucose: 174 mg/dL
eAG (mmol/L): 9.7 mmol/L

## 2021-06-12 LAB — IRON,TIBC AND FERRITIN PANEL
%SAT: 26 % (calc) (ref 20–48)
Ferritin: 206 ng/mL (ref 38–380)
Iron: 83 ug/dL (ref 50–180)
TIBC: 314 mcg/dL (calc) (ref 250–425)

## 2021-06-12 NOTE — Progress Notes (Signed)
Hi Samuel Cherry, LDL cholesterol looks good it is right at 101 goal is under 100 so it is pretty close.  Your triglycerides also look better which is awesome.  They were 454 and now down to 173.  Metabolic panel looks good.  A1c also looking better.  It was 8.3 last time it is now down to 7.7.  So it is improving but we still have a little ways to go I definitely want to see if you are able to get the Trulicity I sent a new prescription yesterday.  That should lower your A1c from about 7.7 to about 6.7 which would be awesome!  Have any problems at the pharmacy let me know.  Your iron levels look much better.  They were previously 55 now up to 10 which is fantastic.  I would recommend continuing iron for about 3 more months and then you can stop it at that point.  No significant protein levels in the urine.

## 2021-06-16 ENCOUNTER — Encounter: Payer: Self-pay | Admitting: Neurology

## 2021-06-17 NOTE — Telephone Encounter (Signed)
He is current on Dapagliflozin-Metformin ER 01-999 mg and Glipizide 5 mg, now taking Trulicity. Continue both pills?  ?

## 2021-06-17 NOTE — Telephone Encounter (Signed)
Once he has been on the Trulicity for 2 weeks he can cut the glipizide in half and take 2.5 mg daily.  Once he has been on it for a month then he can stop the glipizide completely.  But I definitely want him to continue the dapagliflozin/metformin ?

## 2021-07-14 ENCOUNTER — Other Ambulatory Visit: Payer: Self-pay | Admitting: Family Medicine

## 2021-07-14 DIAGNOSIS — E1165 Type 2 diabetes mellitus with hyperglycemia: Secondary | ICD-10-CM

## 2021-07-15 NOTE — Telephone Encounter (Signed)
See note

## 2021-07-15 NOTE — Telephone Encounter (Signed)
Rec he check with insurance to see if covered. Since he is not on insulin it is less likely they will cover ?

## 2021-07-15 NOTE — Telephone Encounter (Signed)
Please advise 

## 2021-09-07 ENCOUNTER — Encounter: Payer: Self-pay | Admitting: Family Medicine

## 2021-09-07 NOTE — Telephone Encounter (Signed)
Patient has been rescheduled to 09/29/21 with Dr Madilyn Fireman. AMUCK

## 2021-09-08 ENCOUNTER — Ambulatory Visit: Payer: BC Managed Care – PPO | Admitting: Family Medicine

## 2021-09-29 ENCOUNTER — Ambulatory Visit: Payer: BC Managed Care – PPO | Admitting: Family Medicine

## 2021-09-29 ENCOUNTER — Encounter: Payer: Self-pay | Admitting: Family Medicine

## 2021-09-29 VITALS — BP 119/66 | HR 69 | Ht 71.0 in | Wt 265.0 lb

## 2021-09-29 DIAGNOSIS — E1169 Type 2 diabetes mellitus with other specified complication: Secondary | ICD-10-CM | POA: Diagnosis not present

## 2021-09-29 DIAGNOSIS — E118 Type 2 diabetes mellitus with unspecified complications: Secondary | ICD-10-CM | POA: Diagnosis not present

## 2021-09-29 LAB — POCT GLYCOSYLATED HEMOGLOBIN (HGB A1C): Hemoglobin A1C: 6.9 % — AB (ref 4.0–5.6)

## 2021-09-29 LAB — GLUCOSE, POCT (MANUAL RESULT ENTRY): POC Glucose: 127 mg/dl — AB (ref 70–99)

## 2021-09-29 MED ORDER — DEXCOM G7 RECEIVER DEVI: 99 refills | Status: DC

## 2021-09-29 MED ORDER — TRULICITY 1.5 MG/0.5ML ~~LOC~~ SOAJ: 1.5000 mg | SUBCUTANEOUS | 1 refills | Status: DC

## 2021-09-29 MED ORDER — DEXCOM G7 SENSOR MISC: 99 refills | Status: DC

## 2021-09-29 NOTE — Progress Notes (Signed)
Established Patient Office Visit  Subjective   Patient ID: Samuel Cherry, male    DOB: 06/04/1970  Age: 51 y.o. MRN: [id number]  Chief Complaint  Patient presents with   Diabetes    HPI  Diabetes - no hypoglycemic events. No wounds or sores that are not healing well. No increased thirst or urination. Checking glucose at home. Taking medications as prescribed without any side effects.  He has been doing well with the Trulicity thus far.  He does still get nauseated for about 2 days right after the injection and then it seems to wear off.  But he does notice that each week.  They did check with the insurance and they will cover one of the continuous glucose monitors including to the freestyle libre and the Dexcom G7.  He is currently checking his blood sugars 3 times a day.  And it is quite expensive to pay for the strips.  Because of the decreased appetite with the Trulicity he is sometimes only eating once a day but he is also having some intermittent hypoglycemic episodes.  He is here today with his wife.    ROS    Objective:     BP 119/66   Pulse 69   Ht 5\' 11"  (1.803 m)   Wt 265 lb (120.2 kg)   SpO2 96%   BMI 36.96 kg/m    Physical Exam Constitutional:      Appearance: He is well-developed.  HENT:     Head: Normocephalic and atraumatic.  Cardiovascular:     Rate and Rhythm: Normal rate and regular rhythm.     Heart sounds: Normal heart sounds.  Pulmonary:     Effort: Pulmonary effort is normal.     Breath sounds: Normal breath sounds.  Skin:    General: Skin is warm and dry.  Neurological:     Mental Status: He is alert and oriented to person, place, and time.  Psychiatric:        Behavior: Behavior normal.      Results for orders placed or performed in visit on 09/29/21  POCT glycosylated hemoglobin (Hb A1C)  Result Value Ref Range   Hemoglobin A1C 6.9 (A) 4.0 - 5.6 %   HbA1c POC (<> result, manual entry)     HbA1c, POC (prediabetic  range)     HbA1c, POC (controlled diabetic range)    POCT Glucose (CBG)  Result Value Ref Range   POC Glucose 127 (A) 70 - 99 mg/dl       The 10-year ASCVD risk score (Arnett DK, et al., 2019) is: 7%    Assessment & Plan:   Problem List Items Addressed This Visit       Endocrine   Controlled diabetes mellitus type 2 with complications ([place]) - Primary    After next week you can increase the Trulicity to 1.5 mg injection weekly.  If you are tolerating it well then you can discontinue the glipizide and continue with the 1.5 mg weekly.  Work on getting the [employer] covered through the mail order.  We will send a prescription through today but it may need some authorization.  He is currently checking his blood sugars 3 times a day.  We also discussed making sure that he is eating small amounts throughout the day even if he does not feel like eating a meal.  Still has some intermittent hypoglycemia.      Relevant Medications   Dulaglutide (TRULICITY) 1.5 0000000 SOPN  Continuous Blood Gluc Receiver (DEXCOM G7 RECEIVER) DEVI   Continuous Blood Gluc Sensor ([place]) MISC    Return in about 3 months (around 01/04/2022) for Diabetes follow-up.    Beatrice Lecher, MD

## 2021-09-29 NOTE — Assessment & Plan Note (Addendum)
After next week you can increase the Trulicity to 1.5 mg injection weekly.  If you are tolerating it well then you can discontinue the glipizide and continue with the 1.5 mg weekly.  Work on getting the [employer] covered through the mail order.  We will send a prescription through today but it may need some authorization.  He is currently checking his blood sugars 3 times a day.  We also discussed making sure that he is eating small amounts throughout the day even if he does not feel like eating a meal.  Still has some intermittent hypoglycemia.

## 2021-09-29 NOTE — Patient Instructions (Signed)
After next week you can increase the Trulicity to 1.5 mg injection weekly.  If you are tolerating it well then you can discontinue the glipizide and continue with the 1.5 mg weekly.

## 2021-10-07 ENCOUNTER — Encounter: Payer: Self-pay | Admitting: Family Medicine

## 2021-10-21 ENCOUNTER — Telehealth: Payer: Self-pay

## 2021-10-21 NOTE — Telephone Encounter (Signed)
Initiated Prior authorization KWI:[id number] G7 Sensor, Via: Covermymeds Case/Key:[id number] Status: approved  as of 10/21/21 Reason: Notified Pt via: Mychart   Initiated Prior authorization GDJ:MEQAST G7 Transmitter  Via: Covermymeds Case/Key:[id number] Status: approved as of 10/21/21 Reason:Coverage Start Date:09/21/2021;Coverage End Date:10/21/2022; Notified Pt via: Mychart

## 2021-10-28 ENCOUNTER — Encounter: Payer: Self-pay | Admitting: Family Medicine

## 2021-11-02 ENCOUNTER — Encounter: Payer: Self-pay | Admitting: Family Medicine

## 2021-11-11 ENCOUNTER — Encounter: Payer: Self-pay | Admitting: Family Medicine

## 2021-11-11 DIAGNOSIS — E118 Type 2 diabetes mellitus with unspecified complications: Secondary | ICD-10-CM

## 2021-11-11 MED ORDER — ONETOUCH ULTRA VI STRP: ORAL_STRIP | 12 refills | Status: DC

## 2021-12-29 ENCOUNTER — Ambulatory Visit: Payer: BC Managed Care – PPO | Admitting: Family Medicine

## 2021-12-29 NOTE — Progress Notes (Deleted)
   Established Patient Office Visit  Subjective   Patient ID: Samuel Cherry, male    DOB: 09-11-1970  Age: 51 y.o. MRN: [id number]  No chief complaint on file.   HPI   Diabetes - no hypoglycemic events. No wounds or sores that are not healing well. No increased thirst or urination. Checking glucose at home. Taking medications as prescribed without any side effects.  {History (Optional):23778}  ROS    Objective:     There were no vitals taken for this visit. {Vitals History (Optional):23777}  Physical Exam Constitutional:      Appearance: He is well-developed.  HENT:     Head: Normocephalic and atraumatic.  Cardiovascular:     Rate and Rhythm: Normal rate and regular rhythm.     Heart sounds: Normal heart sounds.  Pulmonary:     Effort: Pulmonary effort is normal.     Breath sounds: Normal breath sounds.  Skin:    General: Skin is warm and dry.  Neurological:     Mental Status: He is alert and oriented to person, place, and time.  Psychiatric:        Behavior: Behavior normal.      No results found for any visits on 12/29/21.  {Labs (Optional):23779}  The 10-year ASCVD risk score (Arnett DK, et al., 2019) is: 7%    Assessment & Plan:   Problem List Items Addressed This Visit       Endocrine   Controlled diabetes mellitus type 2 with complications (HCC) - Primary    No follow-ups on file.    Nani Gasser, MD

## 2022-01-01 ENCOUNTER — Other Ambulatory Visit: Payer: Self-pay | Admitting: Family Medicine

## 2022-01-01 DIAGNOSIS — E1165 Type 2 diabetes mellitus with hyperglycemia: Secondary | ICD-10-CM

## 2022-01-01 MED ORDER — ATORVASTATIN CALCIUM 20 MG PO TABS: 20.0000 mg | ORAL_TABLET | Freq: Every day | ORAL | 3 refills | Status: DC

## 2022-01-05 ENCOUNTER — Encounter: Payer: Self-pay | Admitting: Family Medicine

## 2022-01-13 ENCOUNTER — Other Ambulatory Visit: Payer: Self-pay | Admitting: *Deleted

## 2022-01-13 DIAGNOSIS — E1169 Type 2 diabetes mellitus with other specified complication: Secondary | ICD-10-CM

## 2022-01-13 MED ORDER — TRULICITY 1.5 MG/0.5ML ~~LOC~~ SOAJ: 1.5000 mg | SUBCUTANEOUS | 1 refills | Status: DC

## 2022-04-06 ENCOUNTER — Telehealth: Payer: Self-pay | Admitting: Family Medicine

## 2022-04-06 MED ORDER — OSELTAMIVIR PHOSPHATE 75 MG PO CAPS: 75.0000 mg | ORAL_CAPSULE | Freq: Every day | ORAL | 0 refills | Status: DC

## 2022-04-06 NOTE — Telephone Encounter (Signed)
Meds ordered this encounter  Medications   oseltamivir (TAMIFLU) 75 MG capsule    Sig: Take 1 capsule (75 mg total) by mouth daily.    Dispense:  10 capsule    Refill:  0    

## 2022-04-06 NOTE — Telephone Encounter (Signed)
Patient spouse came in requesting Prentive son has flu so was told that they will need this medication while he is sick, if possible this can be called in pharmacy?

## 2022-04-07 NOTE — Telephone Encounter (Signed)
Patient advised.

## 2022-04-21 ENCOUNTER — Telehealth: Payer: Self-pay

## 2022-04-21 DIAGNOSIS — E119 Type 2 diabetes mellitus without complications: Secondary | ICD-10-CM | POA: Diagnosis not present

## 2022-04-21 LAB — HM DIABETES EYE EXAM

## 2022-04-21 NOTE — Telephone Encounter (Signed)
Per after hours on call nurse - patient called on 04/17/22 with concerns of elevated blood sugar (178). Patient is also having middle/lower back pain after lifting a golf cart. Feels a pinch in the lower back and pain worsens at night. Patient was experiencing some nausea, burping, and vomited x1. Per patient, feels like he needs to have a BM but is only able to pass a small amount. Denies fever.   Per nurse recommendation, patient was instructed to go to the ED. Patient declined at time of call. I have called the patient for a follow up on current symptoms. Patient is feeling better, but is still having a lot of back pain. Patient has been scheduled tomorrow with provider at 11:30 am for an evaluation of his current symptoms.

## 2022-04-22 ENCOUNTER — Ambulatory Visit: Payer: BC Managed Care – PPO | Admitting: Family Medicine

## 2022-04-22 ENCOUNTER — Encounter: Payer: Self-pay | Admitting: Family Medicine

## 2022-04-22 VITALS — BP 132/78 | HR 85 | Ht 71.0 in | Wt 263.0 lb

## 2022-04-22 DIAGNOSIS — E1169 Type 2 diabetes mellitus with other specified complication: Secondary | ICD-10-CM

## 2022-04-22 DIAGNOSIS — E118 Type 2 diabetes mellitus with unspecified complications: Secondary | ICD-10-CM

## 2022-04-22 DIAGNOSIS — M545 Low back pain, unspecified: Secondary | ICD-10-CM | POA: Diagnosis not present

## 2022-04-22 LAB — POCT GLYCOSYLATED HEMOGLOBIN (HGB A1C): Hemoglobin A1C: 8.8 % — AB (ref 4.0–5.6)

## 2022-04-22 MED ORDER — TRULICITY 3 MG/0.5ML ~~LOC~~ SOAJ: 3.0000 mg | SUBCUTANEOUS | 0 refills | Status: DC

## 2022-04-22 NOTE — Progress Notes (Signed)
Established Patient Office Visit  Subjective   Patient ID: Samuel Cherry, male    DOB: 1970-06-11  Age: 53 y.o. MRN: [id number]  Chief Complaint  Patient presents with   Back Pain   Diabetes    Eye exam requested from [facility]    HPI  Lifted his golf cart and felt a sudden shocking pain in his back.  Took tylenol and it helped.  Then 3 days later was washing dishing and felt a milder version of the pain.  Later that night pain was waking him up. Patient was experiencing some nausea, burping, and vomited x1. Per patient, feels like he needs to have a BM but is only able to pass a small amount. Denies fever.   Diabetes - no hypoglycemic events. No wounds or sores that are not healing well. No increased thirst or urination. Checking glucose at home. Taking medications as prescribed without any side effects. Eye exam is UTD.  He is off the glipizide.     ROS    Objective:     BP 132/78   Pulse 85   Ht 5\' 11"  (1.803 m)   Wt 263 lb (119.3 kg)   SpO2 96%   BMI 36.68 kg/m    Physical Exam Constitutional:      Appearance: He is well-developed.  HENT:     Head: Normocephalic and atraumatic.  Cardiovascular:     Rate and Rhythm: Normal rate and regular rhythm.     Heart sounds: Normal heart sounds.  Pulmonary:     Effort: Pulmonary effort is normal.     Breath sounds: Normal breath sounds.  Skin:    General: Skin is warm and dry.  Neurological:     Mental Status: He is alert and oriented to person, place, and time.  Psychiatric:        Behavior: Behavior normal.      Results for orders placed or performed in visit on 04/22/22  POCT glycosylated hemoglobin (Hb A1C)  Result Value Ref Range   Hemoglobin A1C 8.8 (A) 4.0 - 5.6 %   HbA1c POC (<> result, manual entry)     HbA1c, POC (prediabetic range)     HbA1c, POC (controlled diabetic range)        The 10-year ASCVD risk score (Arnett DK, et al., 2019) is: 8.3%    Assessment & Plan:    Problem List Items Addressed This Visit       Endocrine   Controlled diabetes mellitus type 2 with complications ([place])    P5T jumped up significantly to 8.7.  He admits that he ate a lot of carbs and corn and corn products over the Christmas holidays.  He knew his blood sugars were gena be up significantly.  He is otherwise been tolerating the Trulicity well.  Will bump up dose to 3 mg and encouraged him to get back on track with healthy food choices and regular exercise.  He is actually down a couple of pounds from June.  Encouraged him to try to get his fasting blood sugars back down into the 120s by the end of the month and if he is not able to do so to please reach out.  Otherwise plan to follow-up in 3 months.      Relevant Medications   Dulaglutide (TRULICITY) 3 IR/4.4RX SOPN     Other   Acute left-sided low back pain without sciatica   Other Visit Diagnoses     Controlled type  2 diabetes mellitus with other specified complication, without long-term current use of insulin (HCC)    -  Primary   Relevant Medications   Dulaglutide (TRULICITY) 3 DU/2.0UR SOPN   Other Relevant Orders   POCT glycosylated hemoglobin (Hb A1C) (Completed)      Left low back pain-it sounds like a herniated disc with radicular pain.  Today he is actually feeling better and it does seem to respond well to Tylenol.  If pain continues or recurs then please let me know we could always consider formal physical therapy and imaging if needed.  Return in about 3 months (around 07/22/2022) for Diabetes follow-up.    Beatrice Lecher, MD

## 2022-04-22 NOTE — Assessment & Plan Note (Addendum)
A1C jumped up significantly to 8.7.  He admits that he ate a lot of carbs and corn and corn products over the Christmas holidays.  He knew his blood sugars were gena be up significantly.  He is otherwise been tolerating the Trulicity well.  Will bump up dose to 3 mg and encouraged him to get back on track with healthy food choices and regular exercise.  He is actually down a couple of pounds from June.  Encouraged him to try to get his fasting blood sugars back down into the 120s by the end of the month and if he is not able to do so to please reach out.  Otherwise plan to follow-up in 3 months.

## 2022-04-30 ENCOUNTER — Encounter: Payer: Self-pay | Admitting: Family Medicine

## 2022-04-30 ENCOUNTER — Telehealth: Payer: Self-pay

## 2022-04-30 ENCOUNTER — Other Ambulatory Visit: Payer: Self-pay | Admitting: Family Medicine

## 2022-04-30 DIAGNOSIS — E1169 Type 2 diabetes mellitus with other specified complication: Secondary | ICD-10-CM

## 2022-04-30 NOTE — Telephone Encounter (Signed)
Initiated Prior authorization PFX:[id number] 3MG /0.5ML pen-injectors Via: Covermymeds Case/Key:[id number] Status: approved  as of 04/30/22 Reason:;Coverage Start Date:03/31/2022;Coverage End Date:04/30/2023; Notified Pt via: Mychart

## 2022-05-13 ENCOUNTER — Encounter: Payer: Self-pay | Admitting: Family Medicine

## 2022-07-22 ENCOUNTER — Ambulatory Visit: Payer: BC Managed Care – PPO | Admitting: Family Medicine

## 2022-07-28 ENCOUNTER — Telehealth: Payer: Self-pay | Admitting: Family Medicine

## 2022-07-28 ENCOUNTER — Other Ambulatory Visit: Payer: Self-pay | Admitting: Family Medicine

## 2022-07-28 DIAGNOSIS — E1169 Type 2 diabetes mellitus with other specified complication: Secondary | ICD-10-CM

## 2022-07-28 MED ORDER — TRULICITY 3 MG/0.5ML ~~LOC~~ SOAJ: 3.0000 mg | SUBCUTANEOUS | 1 refills | Status: DC

## 2022-07-28 NOTE — Telephone Encounter (Signed)
Angela sent refill in for pt today

## 2022-07-28 NOTE — Telephone Encounter (Signed)
Patient's wife called in regards to refills to trulicity 3mg /0.55ml he is completely out and is in need of his medication  [facility] - [place], [place] - [address], [place] [address] Phone: [phone]  Fax: [phone]

## 2022-08-16 ENCOUNTER — Ambulatory Visit: Payer: BC Managed Care – PPO | Admitting: Family Medicine

## 2022-08-24 IMAGING — DX DG CHEST 2V
2 series · 2 of 2 positions shown · non-contrast
Comparison: 05/01/2020 chest radiograph.

CLINICAL DATA: COVID positive in [REDACTED], persistent dyspnea on
exertion

EXAM:
CHEST - 2 VIEW

[chest pa]
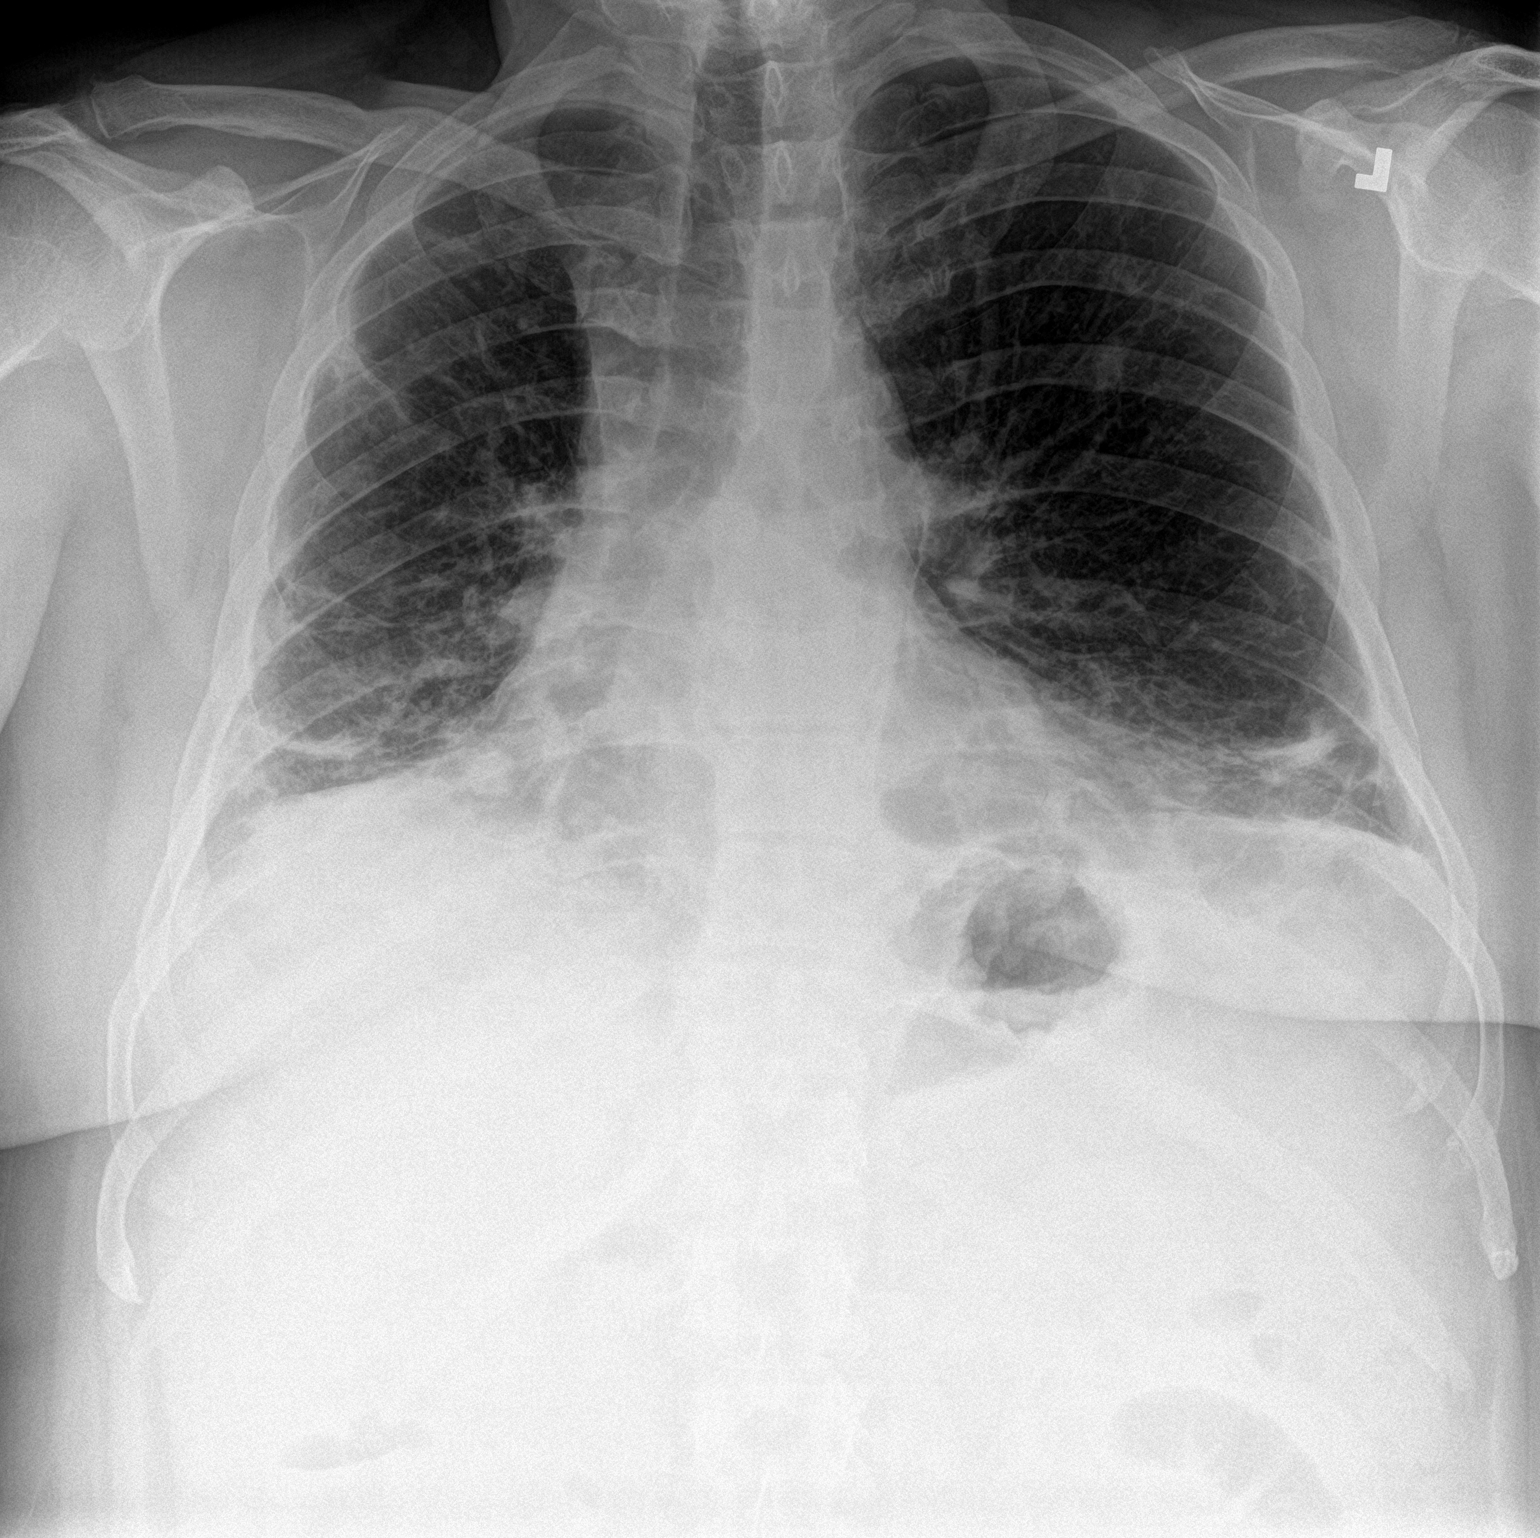

[chest lat]
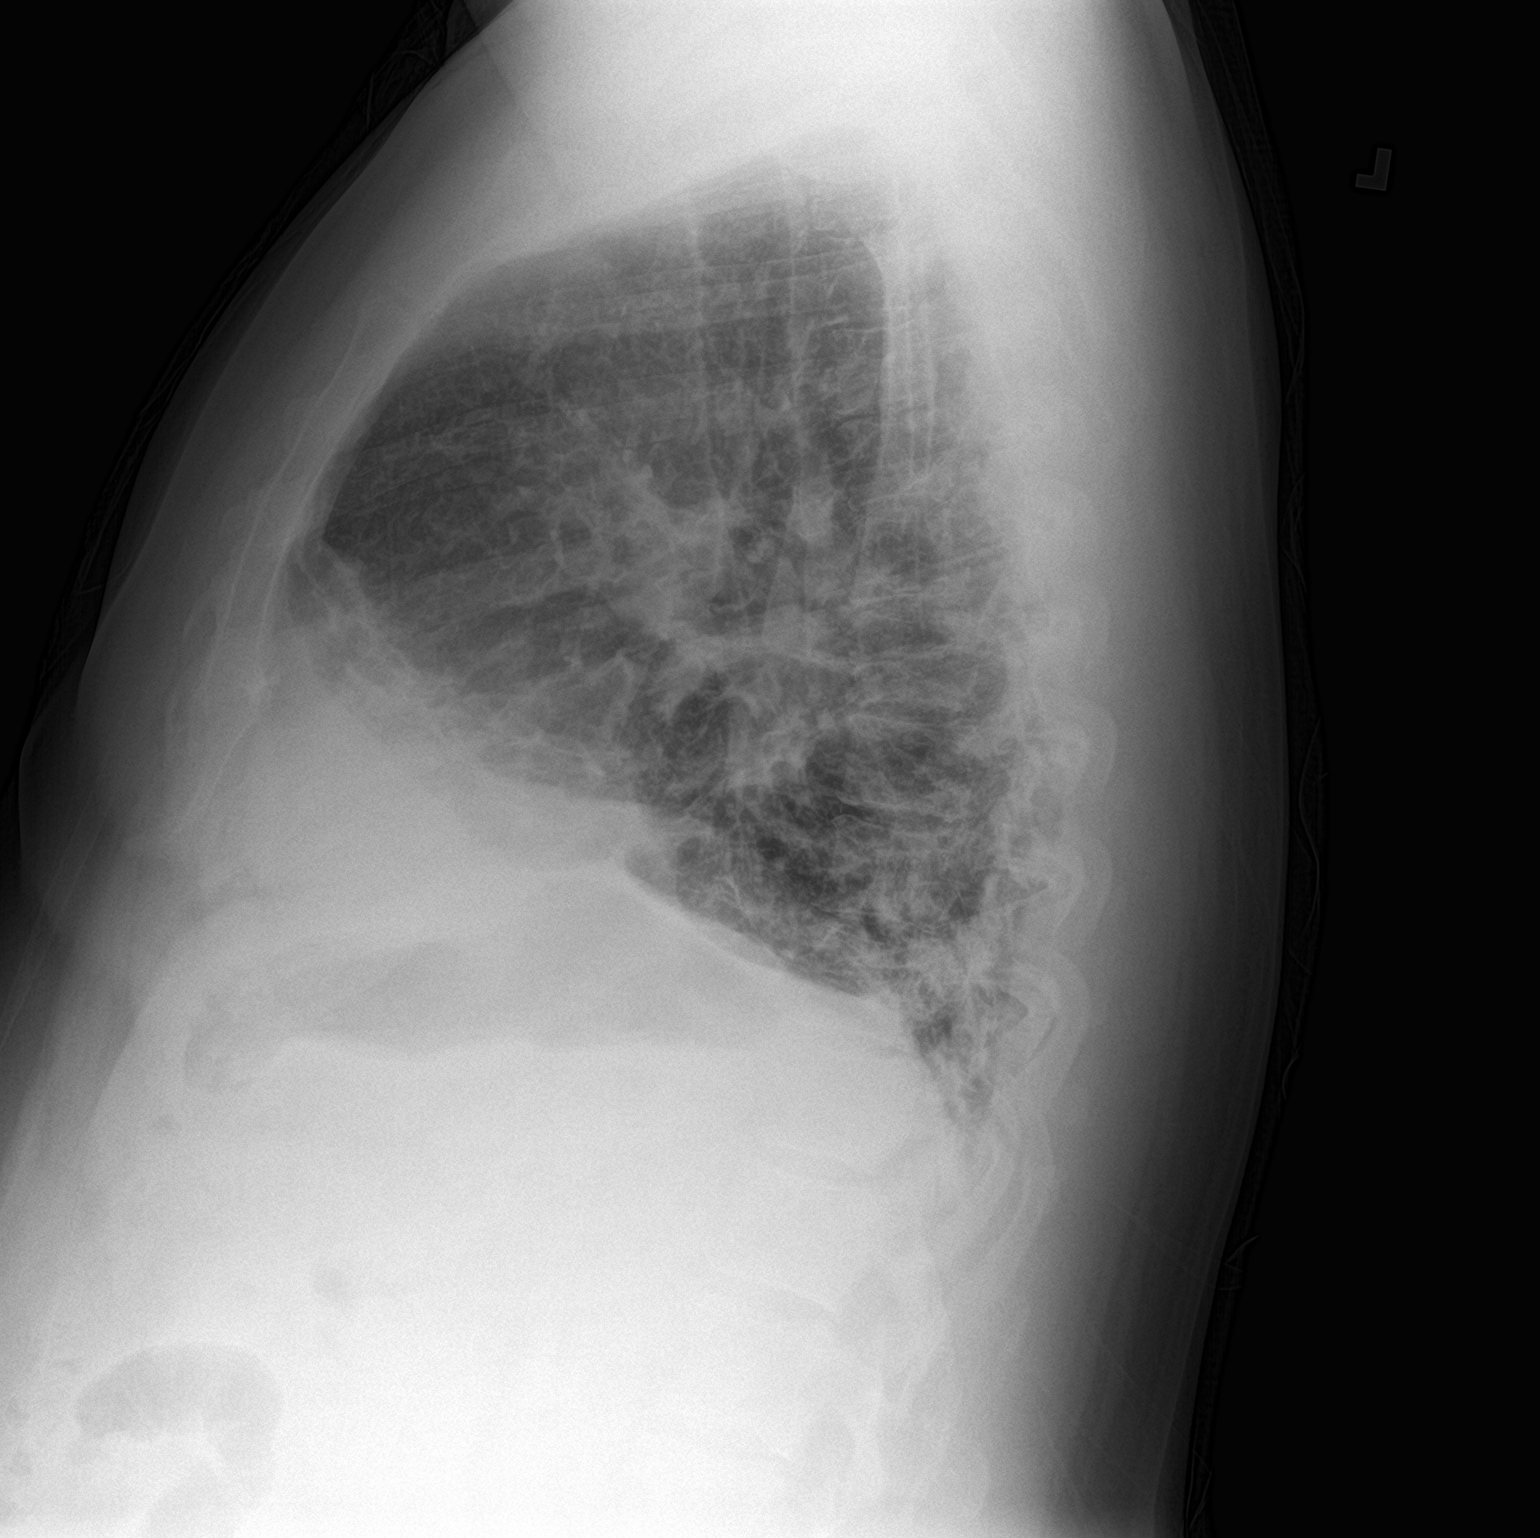

[2 of 2 positions shown; findings below may reference images not displayed]

FINDINGS: Stable cardiomediastinal silhouette with normal heart size. No
pneumothorax. No pleural effusion. Moderate patchy and reticular
opacities throughout both lungs, most prominent in the lower lungs,
slightly improved.
IMPRESSION: Slight improvement in moderate patchy and reticular opacities
throughout both lungs, most prominent in the lower lungs.
Differential includes slowly resolving K9YLI-C9 infection versus
evolving postinflammatory fibrosis. Follow-up high-resolution chest
CT study in 3-6 months may be obtained as clinically warranted.

## 2022-10-26 ENCOUNTER — Encounter: Payer: Self-pay | Admitting: Family Medicine

## 2022-10-26 MED ORDER — ONETOUCH ULTRA TEST VI STRP: ORAL_STRIP | 12 refills | Status: DC

## 2023-01-12 ENCOUNTER — Telehealth: Payer: Self-pay | Admitting: Family Medicine

## 2023-01-12 DIAGNOSIS — E1169 Type 2 diabetes mellitus with other specified complication: Secondary | ICD-10-CM

## 2023-01-12 MED ORDER — TRULICITY 3 MG/0.5ML ~~LOC~~ SOAJ: 3.0000 mg | SUBCUTANEOUS | 0 refills | Status: DC

## 2023-01-12 NOTE — Telephone Encounter (Signed)
Prescription Request  01/12/2023  LOV: 04/22/2022  What is the name of the medication or equipment? Dulaglutide (TRULICITY) 3 MG/0.5ML SOPN 90 day supply   Have you contacted your pharmacy to request a refill? Yes   Which pharmacy would you like this sent to?   [facility] - [place], [place] - [address] [place] [address] Phone: [phone] Fax: [phone]    Patient notified that their request is being sent to the clinical staff for review and that they should receive a response within 2 business days.   Please advise at [facility] [phone]

## 2023-01-12 NOTE — Telephone Encounter (Signed)
Please advise pt that he is due for DM follow up. Will send refill

## 2023-04-06 ENCOUNTER — Other Ambulatory Visit: Payer: Self-pay | Admitting: Family Medicine

## 2023-04-06 DIAGNOSIS — E1169 Type 2 diabetes mellitus with other specified complication: Secondary | ICD-10-CM

## 2023-05-27 DIAGNOSIS — D3501 Benign neoplasm of right adrenal gland: Secondary | ICD-10-CM | POA: Diagnosis not present

## 2023-05-27 DIAGNOSIS — Z7985 Long-term (current) use of injectable non-insulin antidiabetic drugs: Secondary | ICD-10-CM | POA: Diagnosis not present

## 2023-05-27 DIAGNOSIS — N1330 Unspecified hydronephrosis: Secondary | ICD-10-CM | POA: Diagnosis not present

## 2023-05-27 DIAGNOSIS — E1165 Type 2 diabetes mellitus with hyperglycemia: Secondary | ICD-10-CM | POA: Diagnosis not present

## 2023-05-27 DIAGNOSIS — K5730 Diverticulosis of large intestine without perforation or abscess without bleeding: Secondary | ICD-10-CM | POA: Diagnosis not present

## 2023-05-27 DIAGNOSIS — N132 Hydronephrosis with renal and ureteral calculous obstruction: Secondary | ICD-10-CM | POA: Diagnosis not present

## 2023-05-27 DIAGNOSIS — N200 Calculus of kidney: Secondary | ICD-10-CM | POA: Diagnosis not present

## 2023-05-27 DIAGNOSIS — K429 Umbilical hernia without obstruction or gangrene: Secondary | ICD-10-CM | POA: Diagnosis not present

## 2023-05-27 DIAGNOSIS — R6883 Chills (without fever): Secondary | ICD-10-CM | POA: Diagnosis not present

## 2023-05-27 DIAGNOSIS — E669 Obesity, unspecified: Secondary | ICD-10-CM | POA: Diagnosis not present

## 2023-05-27 DIAGNOSIS — Z7984 Long term (current) use of oral hypoglycemic drugs: Secondary | ICD-10-CM | POA: Diagnosis not present

## 2023-05-27 DIAGNOSIS — Z87891 Personal history of nicotine dependence: Secondary | ICD-10-CM | POA: Diagnosis not present

## 2023-05-27 DIAGNOSIS — Z466 Encounter for fitting and adjustment of urinary device: Secondary | ICD-10-CM | POA: Diagnosis not present

## 2023-05-27 DIAGNOSIS — Z6836 Body mass index (BMI) 36.0-36.9, adult: Secondary | ICD-10-CM | POA: Diagnosis not present

## 2023-05-27 DIAGNOSIS — N201 Calculus of ureter: Secondary | ICD-10-CM | POA: Diagnosis not present

## 2023-06-29 ENCOUNTER — Other Ambulatory Visit: Payer: Self-pay | Admitting: Family Medicine

## 2023-06-29 DIAGNOSIS — E1169 Type 2 diabetes mellitus with other specified complication: Secondary | ICD-10-CM

## 2023-07-12 DIAGNOSIS — N201 Calculus of ureter: Secondary | ICD-10-CM | POA: Diagnosis not present

## 2023-07-18 DIAGNOSIS — N132 Hydronephrosis with renal and ureteral calculous obstruction: Secondary | ICD-10-CM | POA: Diagnosis not present

## 2023-07-18 DIAGNOSIS — Z6835 Body mass index (BMI) 35.0-35.9, adult: Secondary | ICD-10-CM | POA: Diagnosis not present

## 2023-07-18 DIAGNOSIS — N200 Calculus of kidney: Secondary | ICD-10-CM | POA: Diagnosis not present

## 2023-07-18 DIAGNOSIS — E119 Type 2 diabetes mellitus without complications: Secondary | ICD-10-CM | POA: Diagnosis not present

## 2023-07-18 DIAGNOSIS — Z87891 Personal history of nicotine dependence: Secondary | ICD-10-CM | POA: Diagnosis not present

## 2023-07-18 DIAGNOSIS — Z466 Encounter for fitting and adjustment of urinary device: Secondary | ICD-10-CM | POA: Diagnosis not present

## 2023-07-18 DIAGNOSIS — E669 Obesity, unspecified: Secondary | ICD-10-CM | POA: Diagnosis not present

## 2023-07-18 DIAGNOSIS — N201 Calculus of ureter: Secondary | ICD-10-CM | POA: Diagnosis not present

## 2023-07-19 ENCOUNTER — Telehealth: Payer: Self-pay

## 2023-07-19 NOTE — Telephone Encounter (Signed)
 Return phone number attached to message is not a correct number.  Answers as "[employer]"  could not complete call.

## 2023-07-19 NOTE — Telephone Encounter (Signed)
 Copied from CRM [id number]. Topic: Clinical - Medication Question >> Jul 18, 2023  2:37 PM Suzette B wrote: Reason for CRM: Tasha from [facility] needed to speak with someone in the office in regards to a clinical question about the patient.  [phone]

## 2023-08-09 ENCOUNTER — Ambulatory Visit: Payer: Self-pay

## 2023-08-09 DIAGNOSIS — E1165 Type 2 diabetes mellitus with hyperglycemia: Secondary | ICD-10-CM

## 2023-08-09 NOTE — Telephone Encounter (Signed)
 Tasha from [facility] called to check on the patients atorvastatin  (LIPITOR) 20 MG tablet. The patient has never filled this prescription so she would like to know if he can get this restarted and if he's having symptoms then she would like for us  to try a different statin for him. Tasha can be reached at [phone]   Copied from CRM [id number]. Topic: Clinical - Medication Question >> Aug 09, 2023  1:53 PM Hamdi H wrote: Reason for CRM: T[facility] from [facility] called to check on the patients atorvastatin  (LIPITOR) 20 MG tablet. The patient has never filled this prescription so she would like to know if he can get this restarted and if he's having symptoms then she would like for us  to try a different statin for him. Tasha can be reached at [phone]. Reason for Disposition  [1] Follow-up call from patient regarding patient's clinical status AND [2] information NON-URGENT  Answer Assessment - Initial Assessment Questions 1. REASON FOR CALL or QUESTION: "What is your reason for calling today?" or "How can I best help you?" or "What question do you have that I can help answer?"     Please see note from [facility] 2. CALLER: Document the source of call. (e.g., laboratory, patient).     Tasha from [facility]  Protocols used: PCP Call - No Triage-A-AH

## 2023-08-10 NOTE — Telephone Encounter (Signed)
 Requesting rx rf of Atorvastatin  20mg   Last written 01/01/2022 Request note reads " The patient has never filled this prescription " Last OV 04/22/2022 Upcoming appt = none

## 2023-08-10 NOTE — Telephone Encounter (Signed)
 I do agree that he needs to take the medication but I want to check with the patient first and make sure he that he is okay with me sending this.

## 2023-08-10 NOTE — Addendum Note (Signed)
 Addended by: Dickie Found on: 08/10/2023 02:39 PM   Modules accepted: Orders

## 2023-08-11 NOTE — Telephone Encounter (Signed)
 Spoke with patient - he states the last time that he had cholesterol checked he understood that the levels were good at that time ( was in 2023)  He is agreeable to coming in for a fasting lipid blood work check if Dr. Greer Leak would recommend this.

## 2023-08-12 MED ORDER — ATORVASTATIN CALCIUM 20 MG PO TABS: 20.0000 mg | ORAL_TABLET | Freq: Every day | ORAL | 3 refills | Status: DC

## 2023-08-12 NOTE — Telephone Encounter (Signed)
 Needs to take it bc of his diabetes but we can also check his lipid if he would like.

## 2023-08-12 NOTE — Addendum Note (Signed)
 Addended by: Hamdan Toscano D on: 08/12/2023 07:10 AM   Modules accepted: Orders

## 2023-08-12 NOTE — Telephone Encounter (Signed)
 Patient scheduled for DM f/u with Dr. Greer Leak on 09/14/23 and will do Lipid panel at this visit as well  Would like this checked before starting cholesterol medication.

## 2023-08-22 DIAGNOSIS — N1330 Unspecified hydronephrosis: Secondary | ICD-10-CM | POA: Diagnosis not present

## 2023-08-22 DIAGNOSIS — N200 Calculus of kidney: Secondary | ICD-10-CM | POA: Diagnosis not present

## 2023-09-14 ENCOUNTER — Encounter: Payer: Self-pay | Admitting: Family Medicine

## 2023-09-14 ENCOUNTER — Ambulatory Visit (INDEPENDENT_AMBULATORY_CARE_PROVIDER_SITE_OTHER): Admitting: Family Medicine

## 2023-09-14 VITALS — BP 132/85 | HR 76 | Ht 71.0 in | Wt 262.1 lb

## 2023-09-14 DIAGNOSIS — E1169 Type 2 diabetes mellitus with other specified complication: Secondary | ICD-10-CM

## 2023-09-14 DIAGNOSIS — N200 Calculus of kidney: Secondary | ICD-10-CM

## 2023-09-14 DIAGNOSIS — Z13220 Encounter for screening for lipoid disorders: Secondary | ICD-10-CM

## 2023-09-14 DIAGNOSIS — E118 Type 2 diabetes mellitus with unspecified complications: Secondary | ICD-10-CM | POA: Diagnosis not present

## 2023-09-14 DIAGNOSIS — Z7985 Long-term (current) use of injectable non-insulin antidiabetic drugs: Secondary | ICD-10-CM

## 2023-09-14 LAB — POCT GLYCOSYLATED HEMOGLOBIN (HGB A1C): Hemoglobin A1C: 7.7 % — AB (ref 4.0–5.6)

## 2023-09-14 MED ORDER — ONETOUCH ULTRA TEST VI STRP: ORAL_STRIP | 12 refills | Status: DC

## 2023-09-14 MED ORDER — TRULICITY 4.5 MG/0.5ML ~~LOC~~ SOAJ: 4.5000 mg | SUBCUTANEOUS | 1 refills | Status: DC

## 2023-09-14 MED ORDER — ONETOUCH ULTRA TEST VI STRP: ORAL_STRIP | 99 refills | Status: AC

## 2023-09-14 NOTE — Assessment & Plan Note (Signed)
 Continue to work on staying really well-hydrated.

## 2023-09-14 NOTE — Assessment & Plan Note (Addendum)
 A1c does look better today at 7.7, down from 8.8.  Discussed increasing Trulicity  to 4.5 mg.  He is currently on a statin.  Will get updated labs to make sure that renal function is stable we will also get a urine microalbumin.

## 2023-09-14 NOTE — Progress Notes (Addendum)
 Established Patient Office Visit  Subjective  Patient ID: Samuel Cherry, male    DOB: Sep 04, 1970  Age: 53 y.o. MRN: [id number]  Chief Complaint  Patient presents with   Diabetes    HPI  Here for follow-up for diabetes and medications.  The last time I saw him was in January 2024 so it has been over a year.  He says since I last saw him he was diagnosed with kidney stones after an ED visit in February.  He ended up having to have a follow-up cystoscopy with stent placement and then had the stones blasted on the right side.  He is really trying hard to drink more water but says he cannot just drink it by itself he usually has to eat a little food with it.  And they recommended that he stay away from certain foods that they think may have increased the risk for developing the stones in the first place.  Diabetes - he has been having some hypoglycemic events.  Currently on Trulicity  3 mg.  He did have to hold his medication for most 2 weeks before his procedure.  No wounds or sores that are not healing well. No increased thirst or urination. Checking glucose at home. Taking medications as prescribed without any side effects.eye appt on June 3rd.  Foot exam performed today.     Says he is not currently taking one of his medications so the only other medicine on his list is the atorvastatin .  He wanted to recheck his labs before he started back on the medication.    ROS    Objective:     BP 132/85   Pulse 76   Ht 5' 11 (1.803 m)   Wt 262 lb 1.9 oz (118.9 kg)   SpO2 97%   BMI 36.56 kg/m    Physical Exam Vitals and nursing note reviewed.  Constitutional:      Appearance: Normal appearance.  HENT:     Head: Normocephalic and atraumatic.  Eyes:     Conjunctiva/sclera: Conjunctivae normal.  Cardiovascular:     Rate and Rhythm: Normal rate and regular rhythm.  Pulmonary:     Effort: Pulmonary effort is normal.     Breath sounds: Normal breath sounds.  Skin:     General: Skin is warm and dry.  Neurological:     Mental Status: He is alert.  Psychiatric:        Mood and Affect: Mood normal.      Results for orders placed or performed in visit on 09/14/23  POCT HgB A1C  Result Value Ref Range   Hemoglobin A1C 7.7 (A) 4.0 - 5.6 %   HbA1c POC (<> result, manual entry)     HbA1c, POC (prediabetic range)     HbA1c, POC (controlled diabetic range)        The 10-year ASCVD risk score (Arnett DK, et al., 2019) is: 10%    Assessment & Plan:   Problem List Items Addressed This Visit       Endocrine   Controlled diabetes mellitus type 2 with complications (HCC) - Primary   A1c does look better today at 7.7, down from 8.8.  Discussed increasing Trulicity  to 4.5 mg.  He is currently on a statin.  Will get updated labs to make sure that renal function is stable we will also get a urine microalbumin.      Relevant Medications   glucose blood (ONETOUCH ULTRA TEST) test strip   Dulaglutide  (  TRULICITY ) 4.5 MG/0.5ML SOAJ   Other Relevant Orders   POCT HgB A1C (Completed)   Urine Microalbumin w/creat. ratio   CMP14+EGFR   Lipid Panel With LDL/HDL Ratio     Genitourinary   Kidney stone on right side   Continue to work on staying really well-hydrated.      Other Visit Diagnoses       Screening for lipid disorders       Relevant Orders   POCT HgB A1C (Completed)   Urine Microalbumin w/creat. ratio   CMP14+EGFR   Lipid Panel With LDL/HDL Ratio     Controlled type 2 diabetes mellitus with other specified complication, without long-term current use of insulin (HCC)       Relevant Medications   glucose blood (ONETOUCH ULTRA TEST) test strip   Dulaglutide  (TRULICITY ) 4.5 MG/0.5ML SOAJ   Other Relevant Orders   Urine Microalbumin w/creat. ratio   CMP14+EGFR   Lipid Panel With LDL/HDL Ratio      Also encouraged him to schedule an eye exam.  Return in about 3 months (around 12/15/2023) for Diabetes follow-up.    Duaine German, MD

## 2023-09-15 LAB — CMP14+EGFR
ALT: 25 IU/L (ref 0–44)
AST: 18 IU/L (ref 0–40)
Albumin: 4 g/dL (ref 3.8–4.9)
Alkaline Phosphatase: 109 IU/L (ref 44–121)
BUN/Creatinine Ratio: 12 (ref 9–20)
BUN: 10 mg/dL (ref 6–24)
Bilirubin Total: 0.4 mg/dL (ref 0.0–1.2)
CO2: 19 mmol/L — ABNORMAL LOW (ref 20–29)
Calcium: 9.3 mg/dL (ref 8.7–10.2)
Chloride: 106 mmol/L (ref 96–106)
Creatinine, Ser: 0.85 mg/dL (ref 0.76–1.27)
Globulin, Total: 2.9 g/dL (ref 1.5–4.5)
Glucose: 157 mg/dL — ABNORMAL HIGH (ref 70–99)
Potassium: 4.8 mmol/L (ref 3.5–5.2)
Sodium: 140 mmol/L (ref 134–144)
Total Protein: 6.9 g/dL (ref 6.0–8.5)
eGFR: 104 mL/min/{1.73_m2} (ref >59)

## 2023-09-15 LAB — LIPID PANEL WITH LDL/HDL RATIO
Cholesterol, Total: 163 mg/dL (ref 100–199)
HDL: 35 mg/dL — ABNORMAL LOW (ref >39)
LDL Chol Calc (NIH): 91 mg/dL (ref 0–99)
LDL/HDL Ratio: 2.6 ratio (ref 0.0–3.6)
Triglycerides: 219 mg/dL — ABNORMAL HIGH (ref 0–149)
VLDL Cholesterol Cal: 37 mg/dL (ref 5–40)

## 2023-09-15 LAB — MICROALBUMIN / CREATININE URINE RATIO
Creatinine, Urine: 109.5 mg/dL
Microalb/Creat Ratio: 6 mg/g{creat} (ref 0–29)
Microalbumin, Urine: 6.7 ug/mL

## 2023-09-16 ENCOUNTER — Ambulatory Visit: Payer: Self-pay | Admitting: Family Medicine

## 2023-09-16 DIAGNOSIS — E1169 Type 2 diabetes mellitus with other specified complication: Secondary | ICD-10-CM

## 2023-09-16 DIAGNOSIS — E118 Type 2 diabetes mellitus with unspecified complications: Secondary | ICD-10-CM

## 2023-09-16 NOTE — Progress Notes (Signed)
 Samuel Cherry, metabolic panel overall looks good.  LDL cholesterol is at 91 so looks okay.  No excess protein in the urine which is great.  Continue with your atorvastatin  and Trulicity .  Continue to work on healthy diet and regular exercise and try to get your eye exam scheduled when you can.

## 2023-09-20 DIAGNOSIS — H40013 Open angle with borderline findings, low risk, bilateral: Secondary | ICD-10-CM | POA: Diagnosis not present

## 2023-09-29 NOTE — Telephone Encounter (Signed)
 Requesting rx rf of Dexcom sensor  Last written 09/29/2021 Last OV 09/14/2023 ( Looks like glucose test strips sent in at this visit)  Upcoming appt 12/15/2023

## 2023-09-30 MED ORDER — DEXCOM G7 SENSOR MISC: 99 refills | Status: DC

## 2023-09-30 NOTE — Telephone Encounter (Signed)
 Meds ordered this encounter  Medications   Continuous Glucose Sensor (DEXCOM G7 SENSOR) MISC    Sig: Apply and wear continuously for up to 10 days.    Dispense:  9 each    Refill:  PRN

## 2023-10-05 ENCOUNTER — Encounter: Payer: Self-pay | Admitting: Family Medicine

## 2023-10-05 ENCOUNTER — Telehealth: Payer: Self-pay

## 2023-10-05 DIAGNOSIS — E118 Type 2 diabetes mellitus with unspecified complications: Secondary | ICD-10-CM

## 2023-10-05 DIAGNOSIS — E1169 Type 2 diabetes mellitus with other specified complication: Secondary | ICD-10-CM

## 2023-10-05 MED ORDER — DEXCOM G7 SENSOR MISC
99 refills | Status: DC
Start: 2023-10-05 — End: 2024-02-10

## 2023-10-05 NOTE — Telephone Encounter (Signed)
 Pharmacy Patient Advocate Encounter   Received notification from Fax that prior authorization for Dexcom G7 Sensor is required/requested.   Insurance verification completed.   The patient is insured through [employer] .   Per test claim: PA required; PA submitted to above mentioned insurance via Fax Key/confirmation #/EOC [id number] Status is pending   Fax# [phone] Phone# [phone]

## 2023-10-05 NOTE — Addendum Note (Signed)
 Addended by: Doretha Ganja on: 10/05/2023 01:01 PM   Modules accepted: Orders

## 2023-10-07 ENCOUNTER — Other Ambulatory Visit (HOSPITAL_COMMUNITY): Payer: Self-pay

## 2023-10-07 NOTE — Telephone Encounter (Signed)
 Sent patient a Mychart message that Atorvastatin  was written as full year supply in April 2025.

## 2023-10-07 NOTE — Telephone Encounter (Signed)
 Pharmacy Patient Advocate Encounter  Received notification from [facility] that Prior Authorization for Dexcom G7 sensors has been DENIED.  Full denial letter will be uploaded to the media tab. See denial reason below.   PA #/Case ID/Reference #: [id number]

## 2023-10-10 ENCOUNTER — Telehealth: Payer: Self-pay | Admitting: Pharmacist

## 2023-10-10 MED ORDER — ATORVASTATIN CALCIUM 20 MG PO TABS: 20.0000 mg | ORAL_TABLET | Freq: Every day | ORAL | 3 refills | Status: AC

## 2023-10-10 NOTE — Telephone Encounter (Signed)
 Appeal has been submitted for Dexcom G7. Will advise when response is received, please be advised that most companies may take 30 days to make a decision. Appeal letter and supporting documentation have been faxed to [phone] on 10/10/2023 @1 :08 pm.  Thank you, Devere Pandy, PharmD Clinical Pharmacist  [facility]  Direct Dial: [phone]

## 2023-10-10 NOTE — Addendum Note (Signed)
 Addended by: BONNY JON DEL on: 10/10/2023 02:45 PM   Modules accepted: Orders

## 2023-10-11 NOTE — Telephone Encounter (Signed)
 Additional information has been requested from the patient's insurance in order to proceed with the appeal request. Requested information has been sent, or form has been filled out and faxed back to [phone]

## 2023-10-24 NOTE — Telephone Encounter (Signed)
 Insurance denied the appeal for Dexcom G7:

## 2023-10-24 NOTE — Telephone Encounter (Signed)
  Pls call pt and let heim know inuranc eill cover meter if had 2 sugars less than 54 over the last 6 months.  Has he had any documented lows in this range?  That we could submit to the insurance

## 2023-10-27 NOTE — Telephone Encounter (Signed)
 Attempted call to patient. Left a voice mail message requesting a return call.

## 2023-10-27 NOTE — Telephone Encounter (Signed)
 Spoke with patient states he stopped using the dexcom about 2 moths ago because he could not get refills.  He states that he checked his phone for old readings but none were below 54 .  He knows he has had some below 54 . ( Each time he has a device that would not work he would have to call and reinstall the app and would lose all of the old readings)  He states his neighbors glucometer recorded two of his readings in the 40's but he is not sure if she still has this for documentation.  He stats that when he has a low BS reading he knows because he will become dizzy and shaky but does not always have his device to check the reading - he states this happened once while driving as well.  He is going to check with the neighbor but not sure if he can supply the required proof of the low readings necessary.

## 2023-10-28 NOTE — Telephone Encounter (Signed)
 Vanicia, can you check to see if we have any samples we could give him?  I know we used to keep them in that office across from me but they are not there so I was not sure we were keeping them over at [place] or if they are in another place

## 2023-10-28 NOTE — Telephone Encounter (Signed)
 Forwarding to pharmacy team to see if they can assist with getting patient supplies for his Dexcom if at all possible.  If he just does not qualify then that is fine.

## 2023-10-28 NOTE — Telephone Encounter (Signed)
 Does the office have 2 Dexcom samples to provide patient with? If these lows are recurrent and the samples can capture them, should be able to get patient approved.  If we do have samples, can have Channing follow up with patient once she returns next week to connect to the Dexcom portal for remote access to his readings for easy documentation of any lows that occur.

## 2023-11-01 DIAGNOSIS — N201 Calculus of ureter: Secondary | ICD-10-CM | POA: Diagnosis not present

## 2023-11-01 DIAGNOSIS — N202 Calculus of kidney with calculus of ureter: Secondary | ICD-10-CM | POA: Diagnosis not present

## 2023-11-01 DIAGNOSIS — N200 Calculus of kidney: Secondary | ICD-10-CM | POA: Diagnosis not present

## 2023-11-01 NOTE — Telephone Encounter (Signed)
 There are no samples of sensors available at [facility] or [facility].

## 2023-12-15 ENCOUNTER — Ambulatory Visit: Admitting: Family Medicine

## 2024-02-07 ENCOUNTER — Telehealth: Payer: Self-pay | Admitting: Family Medicine

## 2024-02-07 DIAGNOSIS — E118 Type 2 diabetes mellitus with unspecified complications: Secondary | ICD-10-CM

## 2024-02-07 NOTE — Telephone Encounter (Signed)
 Call pt: Notified by his insurance. OneTouch blood glucose monitoring products by [employer] are no longer available on formulary. Please begin to transition this patient to one of the following formulary preferred glucometers: TruMetrix by [employer] or FreeStyle by [employer]. Both manufacturers offer free meter programs to assist in this transition.

## 2024-02-07 NOTE — Telephone Encounter (Signed)
 Attempted call to patient. Left a voice mail message requesting a return call.

## 2024-02-10 ENCOUNTER — Other Ambulatory Visit: Payer: Self-pay | Admitting: *Deleted

## 2024-02-10 MED ORDER — FREESTYLE LIBRE 3 PLUS SENSOR MISC: 3 refills | Status: AC

## 2024-02-10 MED ORDER — FREESTYLE LIBRE 3 READER DEVI: 1.0000 | Freq: Once | 0 refills | Status: AC

## 2024-02-10 NOTE — Telephone Encounter (Signed)
 Patient states his current glucometer is not that good anyway - he would like to know which one Dr. Alvan thinks would be the best and have that sent to [facility] [address] [place]

## 2024-02-10 NOTE — Telephone Encounter (Signed)
 Honestly am not sure.  We may have to wait and see if it requires a PA

## 2024-02-10 NOTE — Telephone Encounter (Signed)
 Do you think that his insurance will pay for the [facility] since he is technically not on insulin??

## 2024-02-10 NOTE — Telephone Encounter (Signed)
 We can send in the freestyle libre 3 continuous glucose monitor for him.

## 2024-02-10 NOTE — Telephone Encounter (Signed)
 Prescription sent

## 2024-02-13 ENCOUNTER — Other Ambulatory Visit (HOSPITAL_COMMUNITY): Payer: Self-pay

## 2024-02-13 ENCOUNTER — Telehealth: Payer: Self-pay

## 2024-02-13 NOTE — Telephone Encounter (Signed)
 Pharmacy Patient Advocate Encounter   Received notification from CoverMyMeds that prior authorization for Freestyle Libre 3 Plus sensor is required/requested.   Insurance verification completed.   The patient is insured through [employer].   Per test claim: PA required; PA started via CoverMyMeds. KEY [id number] . Waiting for clinical questions to populate.

## 2024-02-14 NOTE — Telephone Encounter (Signed)
**Note De-identified  [place] Obfuscation** Please advise 

## 2024-02-14 NOTE — Telephone Encounter (Signed)
 Please call pt and ask him

## 2024-02-15 NOTE — Telephone Encounter (Signed)
 Attempted to contact the patient, no answer. Left a detailed vm msg requesting a call back to the clinic. Direct call back info provided.

## 2024-02-15 NOTE — Telephone Encounter (Signed)
 Spoke with the pt he states he has had in the readings below 54 mg but doesn't have any proof as far as a log or pictures of it which is what the insurance company is asking for. Pt also states he is only checking it once a day in the AM because of how expensive the strips are. Pls advise thank you Annabella Rigg, CMA

## 2024-02-15 NOTE — Telephone Encounter (Unsigned)
 Copied from CRM [id number]. Topic: General - Other >> Feb 15, 2024  9:46 AM Treva T wrote: Reason for CRM: Patient states he is returning a missed call to office.  Can be reached back at [phone].  Patient is aware of same day call back.

## 2024-02-21 ENCOUNTER — Other Ambulatory Visit (HOSPITAL_COMMUNITY): Payer: Self-pay

## 2024-02-21 NOTE — Telephone Encounter (Signed)
 Spoke with the pt he states he has had in the readings below 54 mg but doesn't have any proof as far as a log or pictures of it which is what the insurance company is asking for. Pt also states he is only checking it once a day in the AM because of how expensive the strips are. Pls advise thank you Annabella Rigg, CMA

## 2024-02-22 ENCOUNTER — Other Ambulatory Visit (HOSPITAL_COMMUNITY): Payer: Self-pay

## 2024-02-22 NOTE — Telephone Encounter (Signed)
 Pharmacy Patient Advocate Encounter  Insurance verification completed.   The patient is insured through [employer]   Ran test claim for True Metrix blood glucose test strips. Currently a quantity of 100 is a 30 day supply and the co-pay is $25 .   This test claim was processed through [facility]- copay amounts may vary at other pharmacies due to pharmacy/plan contracts, or as the patient moves through the different stages of their insurance plan.   We would need a log or some kind of documentation of a glucose level less than 54 within the last 6 months for or a severe event characterized by altered mental and/or physical status requiring assistance for treatment of hypoglycemia to be able to submit a PA for the CGM sensors.

## 2024-03-02 ENCOUNTER — Other Ambulatory Visit (HOSPITAL_COMMUNITY): Payer: Self-pay

## 2024-03-20 ENCOUNTER — Other Ambulatory Visit (HOSPITAL_COMMUNITY): Payer: Self-pay

## 2024-03-20 NOTE — Telephone Encounter (Signed)
 Attempted call to patient. Left a voice mail message requesting a return call.

## 2024-03-20 NOTE — Telephone Encounter (Signed)
 Received a prior authorization request for [facility] 3 reader device.   Was the patient able to get the sensors?  If the patient has been tracking his glucose and it shows low blood sugar, it would help in being able to get it approved.   Please advise.

## 2024-03-22 NOTE — Telephone Encounter (Signed)
 Again attempted call to patient. Left a voice mail message requesting a return call.

## 2024-04-06 ENCOUNTER — Other Ambulatory Visit: Payer: Self-pay | Admitting: Family Medicine

## 2024-04-06 ENCOUNTER — Other Ambulatory Visit: Payer: Self-pay

## 2024-04-06 MED ORDER — TRULICITY 4.5 MG/0.5ML ~~LOC~~ SOAJ: 4.5000 mg | SUBCUTANEOUS | 0 refills | Status: AC
